# Patient Record
Sex: Female | Born: 1970
Health system: Southern US, Community
[De-identification: ages and names within clinical notes are randomized; demographics above are authoritative.]

## PROBLEM LIST (undated history)

## (undated) DIAGNOSIS — E0590 Thyrotoxicosis, unspecified without thyrotoxic crisis or storm: Secondary | ICD-10-CM

## (undated) DIAGNOSIS — C801 Malignant (primary) neoplasm, unspecified: Secondary | ICD-10-CM

## (undated) DIAGNOSIS — F329 Major depressive disorder, single episode, unspecified: Secondary | ICD-10-CM

## (undated) DIAGNOSIS — K589 Irritable bowel syndrome without diarrhea: Secondary | ICD-10-CM

## (undated) DIAGNOSIS — G43909 Migraine, unspecified, not intractable, without status migrainosus: Secondary | ICD-10-CM

## (undated) DIAGNOSIS — N871 Moderate cervical dysplasia: Secondary | ICD-10-CM

## (undated) DIAGNOSIS — K5904 Chronic idiopathic constipation: Secondary | ICD-10-CM

## (undated) DIAGNOSIS — K219 Gastro-esophageal reflux disease without esophagitis: Secondary | ICD-10-CM

## (undated) DIAGNOSIS — F419 Anxiety disorder, unspecified: Secondary | ICD-10-CM

## (undated) DIAGNOSIS — M722 Plantar fascial fibromatosis: Secondary | ICD-10-CM

## (undated) DIAGNOSIS — F32A Depression, unspecified: Secondary | ICD-10-CM

## (undated) HISTORY — DX: Thyrotoxicosis, unspecified without thyrotoxic crisis or storm: E05.90

## (undated) HISTORY — DX: Depression, unspecified: F32.A

## (undated) HISTORY — PX: COLONOSCOPY: SHX174

## (undated) HISTORY — DX: Chronic idiopathic constipation: K59.04

## (undated) HISTORY — PX: OTHER SURGICAL HISTORY: SHX169

## (undated) HISTORY — DX: Migraine, unspecified, not intractable, without status migrainosus: G43.909

## (undated) HISTORY — DX: Gastro-esophageal reflux disease without esophagitis: K21.9

## (undated) HISTORY — DX: Irritable bowel syndrome, unspecified: K58.9

## (undated) HISTORY — PX: MOHS SURGERY: SUR867

## (undated) HISTORY — DX: Moderate cervical dysplasia: N87.1

## (undated) HISTORY — DX: Anxiety disorder, unspecified: F41.9

## (undated) HISTORY — DX: Major depressive disorder, single episode, unspecified: F32.9

## (undated) HISTORY — DX: Malignant (primary) neoplasm, unspecified: C80.1

## (undated) HISTORY — DX: Plantar fascial fibromatosis: M72.2

---

## 1996-01-18 DIAGNOSIS — N871 Moderate cervical dysplasia: Secondary | ICD-10-CM

## 1996-01-18 HISTORY — PX: CERVICAL BIOPSY  W/ LOOP ELECTRODE EXCISION: SUR135

## 1996-01-18 HISTORY — DX: Moderate cervical dysplasia: N87.1

## 1997-05-19 ENCOUNTER — Other Ambulatory Visit: Admission: RE | Admit: 1997-05-19 | Discharge: 1997-05-19 | Payer: Self-pay | Admitting: Gynecology

## 1997-08-29 ENCOUNTER — Ambulatory Visit (HOSPITAL_COMMUNITY): Admission: RE | Admit: 1997-08-29 | Discharge: 1997-08-29 | Payer: Self-pay | Admitting: Gastroenterology

## 2000-11-30 ENCOUNTER — Other Ambulatory Visit: Admission: RE | Admit: 2000-11-30 | Discharge: 2000-11-30 | Payer: Self-pay | Admitting: Gynecology

## 2002-01-09 ENCOUNTER — Other Ambulatory Visit: Admission: RE | Admit: 2002-01-09 | Discharge: 2002-01-09 | Payer: Self-pay | Admitting: Gynecology

## 2003-02-27 ENCOUNTER — Emergency Department (HOSPITAL_COMMUNITY): Admission: EM | Admit: 2003-02-27 | Discharge: 2003-02-27 | Payer: Self-pay | Admitting: Family Medicine

## 2003-03-11 ENCOUNTER — Other Ambulatory Visit: Admission: RE | Admit: 2003-03-11 | Discharge: 2003-03-11 | Payer: Self-pay | Admitting: Gynecology

## 2004-04-21 ENCOUNTER — Other Ambulatory Visit: Admission: RE | Admit: 2004-04-21 | Discharge: 2004-04-21 | Payer: Self-pay | Admitting: Gynecology

## 2005-06-17 ENCOUNTER — Other Ambulatory Visit: Admission: RE | Admit: 2005-06-17 | Discharge: 2005-06-17 | Payer: Self-pay | Admitting: Gynecology

## 2006-01-31 ENCOUNTER — Encounter: Admission: RE | Admit: 2006-01-31 | Discharge: 2006-01-31 | Payer: Self-pay | Admitting: Endocrinology

## 2006-06-30 ENCOUNTER — Other Ambulatory Visit: Admission: RE | Admit: 2006-06-30 | Discharge: 2006-06-30 | Payer: Self-pay | Admitting: Gynecology

## 2007-08-02 ENCOUNTER — Other Ambulatory Visit: Admission: RE | Admit: 2007-08-02 | Discharge: 2007-08-02 | Payer: Self-pay | Admitting: Gynecology

## 2008-08-06 ENCOUNTER — Other Ambulatory Visit: Admission: RE | Admit: 2008-08-06 | Discharge: 2008-08-06 | Payer: Self-pay | Admitting: Gynecology

## 2008-08-06 ENCOUNTER — Encounter: Payer: Self-pay | Admitting: Women's Health

## 2008-08-06 ENCOUNTER — Ambulatory Visit: Payer: Self-pay | Admitting: Women's Health

## 2009-08-28 ENCOUNTER — Other Ambulatory Visit: Admission: RE | Admit: 2009-08-28 | Discharge: 2009-08-28 | Payer: Self-pay | Admitting: Gynecology

## 2009-08-28 ENCOUNTER — Ambulatory Visit: Payer: Self-pay | Admitting: Women's Health

## 2009-09-03 ENCOUNTER — Encounter: Admission: RE | Admit: 2009-09-03 | Discharge: 2009-09-03 | Payer: Self-pay | Admitting: Gynecology

## 2010-09-02 DIAGNOSIS — E0590 Thyrotoxicosis, unspecified without thyrotoxic crisis or storm: Secondary | ICD-10-CM | POA: Insufficient documentation

## 2010-09-09 ENCOUNTER — Encounter: Payer: Self-pay | Admitting: Women's Health

## 2010-09-10 ENCOUNTER — Other Ambulatory Visit (HOSPITAL_COMMUNITY)
Admission: RE | Admit: 2010-09-10 | Discharge: 2010-09-10 | Disposition: A | Discharge status: Home / Self Care | Payer: BC Managed Care – PPO | Source: Ambulatory Visit | Attending: Women's Health | Admitting: Women's Health

## 2010-09-10 ENCOUNTER — Ambulatory Visit (INDEPENDENT_AMBULATORY_CARE_PROVIDER_SITE_OTHER): Payer: BC Managed Care – PPO | Admitting: Women's Health

## 2010-09-10 ENCOUNTER — Encounter: Payer: Self-pay | Admitting: Women's Health

## 2010-09-10 VITALS — BP 130/90 | Ht 62.0 in | Wt 156.0 lb

## 2010-09-10 DIAGNOSIS — Z309 Encounter for contraceptive management, unspecified: Secondary | ICD-10-CM

## 2010-09-10 DIAGNOSIS — F411 Generalized anxiety disorder: Secondary | ICD-10-CM

## 2010-09-10 DIAGNOSIS — N809 Endometriosis, unspecified: Secondary | ICD-10-CM | POA: Insufficient documentation

## 2010-09-10 DIAGNOSIS — Z131 Encounter for screening for diabetes mellitus: Secondary | ICD-10-CM

## 2010-09-10 DIAGNOSIS — Z01419 Encounter for gynecological examination (general) (routine) without abnormal findings: Secondary | ICD-10-CM | POA: Insufficient documentation

## 2010-09-10 DIAGNOSIS — L299 Pruritus, unspecified: Secondary | ICD-10-CM

## 2010-09-10 DIAGNOSIS — IMO0001 Reserved for inherently not codable concepts without codable children: Secondary | ICD-10-CM

## 2010-09-10 DIAGNOSIS — F419 Anxiety disorder, unspecified: Secondary | ICD-10-CM | POA: Insufficient documentation

## 2010-09-10 MED ORDER — NYSTATIN-TRIAMCINOLONE 100000-0.1 UNIT/GM-% EX OINT: TOPICAL_OINTMENT | Freq: Two times a day (BID) | CUTANEOUS | Status: AC

## 2010-09-10 MED ORDER — LEVONORGEST-ETH ESTRAD 91-DAY 0.15-0.03 MG PO TABS: 1.0000 | ORAL_TABLET | Freq: Every day | ORAL | Status: DC

## 2010-09-10 MED ORDER — VENLAFAXINE HCL 100 MG PO TABS: 100.0000 mg | ORAL_TABLET | Freq: Once | ORAL | Status: DC

## 2010-09-10 NOTE — Progress Notes (Signed)
Carrie Jensen March 23, 1970 [id number]    History:    The patient presents for annual exam.  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   Past medical history, past surgical history, family history and social history were all reviewed and documented in the EPIC chart.   ROS:  A  ROS was performed and pertinent positives and negatives are included in the history.  Exam:  Filed Vitals:   09/10/10 1622  BP: 130/90    General appearance:  Normal Head/Neck:  Normal, without cervical or supraclavicular adenopathy. Thyroid:  Symmetrical, normal in size, without palpable masses or nodularity. Respiratory  Effort:  Normal  Auscultation:  Clear without wheezing or rhonchi Cardiovascular  Auscultation:  Regular rate, without rubs, murmurs or gallops  Edema/varicosities:  Not grossly evident Abdominal  Soft,nontender, without masses, guarding or rebound.  Liver/spleen:  No organomegaly noted  Hernia:  None appreciated  Skin  Inspection:  Grossly normal  Palpation:  Grossly normal Neurologic/psychiatric  Orientation:  Normal with appropriate conversation.  Mood/affect:  Normal  Genitourinary    Breasts: Examined lying and sitting.     Right: Without masses, retractions, discharge or axillary adenopathy.     Left: Without masses, retractions, discharge or axillary adenopathy.   Inguinal/mons:  Normal without inguinal adenopathy  External genitalia:  Normal  BUS/Urethra/Skene's glands:  Normal  Bladder:  Normal  Vagina:  Normal  Cervix:  Normal status post leep (98)  Uterus: Fibroids/nontender, 10 wk size,  Midline and mobile  Adnexa/parametria:     Rt: Without masses or tenderness.   Lt: Without masses or tenderness.  Anus and perineum: Normal  Digital rectal exam: Normal sphincter tone without palpated masses or tenderness  Assessment/Plan:  40 y.o.MWF G0  for annual exam.  On Seasonale having a 3-5 days cycle every 3 months. Having less problems with migraines. She does  see Dr. Talmage Nap for her hyperthyroidism. SBEs, yearly mammogram, which have been normal. 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 Will check her blood pressure at work and call if greater than 130/80. She exercises on a regular basis and will continue, she's been on Effexor for anxiety states she feels she needs a higher dose. Will try 100 she's been on Effexor 75 will call if no relief and did review we could increase it to 150. Reviewed importance of leisure activities and exercise in relationship to stress  Prescription for the Seasonale was given, did review slight risk for blood clots,strokes. Will check a CBC glucose Pap today. Will check a lipid profile next year, she had a normal lipid profile last year.    Harrington Challenger [facility], 5:39 PM 09/10/2010

## 2010-09-15 MED ORDER — VENLAFAXINE HCL ER 37.5 MG PO CP24: 37.5000 mg | ORAL_CAPSULE | Freq: Every day | ORAL | Status: DC

## 2010-09-15 MED ORDER — VENLAFAXINE HCL ER 75 MG PO CP24: 75.0000 mg | ORAL_CAPSULE | Freq: Every day | ORAL | Status: DC

## 2010-09-15 NOTE — Progress Notes (Signed)
Addended by: Harrington Challenger on: 09/15/2010 02:16 PM   Modules accepted: Orders

## 2011-10-17 ENCOUNTER — Other Ambulatory Visit: Payer: Self-pay | Admitting: Women's Health

## 2011-10-17 ENCOUNTER — Ambulatory Visit (INDEPENDENT_AMBULATORY_CARE_PROVIDER_SITE_OTHER): Payer: BC Managed Care – PPO | Admitting: Women's Health

## 2011-10-17 ENCOUNTER — Encounter: Payer: Self-pay | Admitting: Women's Health

## 2011-10-17 VITALS — BP 132/84 | Ht 61.5 in | Wt 156.0 lb

## 2011-10-17 DIAGNOSIS — Z01419 Encounter for gynecological examination (general) (routine) without abnormal findings: Secondary | ICD-10-CM

## 2011-10-17 DIAGNOSIS — Z13220 Encounter for screening for lipoid disorders: Secondary | ICD-10-CM

## 2011-10-17 DIAGNOSIS — F419 Anxiety disorder, unspecified: Secondary | ICD-10-CM

## 2011-10-17 DIAGNOSIS — E079 Disorder of thyroid, unspecified: Secondary | ICD-10-CM

## 2011-10-17 DIAGNOSIS — R519 Headache, unspecified: Secondary | ICD-10-CM

## 2011-10-17 DIAGNOSIS — Z309 Encounter for contraceptive management, unspecified: Secondary | ICD-10-CM

## 2011-10-17 DIAGNOSIS — Z833 Family history of diabetes mellitus: Secondary | ICD-10-CM

## 2011-10-17 DIAGNOSIS — IMO0001 Reserved for inherently not codable concepts without codable children: Secondary | ICD-10-CM

## 2011-10-17 DIAGNOSIS — R51 Headache: Secondary | ICD-10-CM

## 2011-10-17 DIAGNOSIS — F411 Generalized anxiety disorder: Secondary | ICD-10-CM

## 2011-10-17 MED ORDER — PROMETHAZINE HCL 25 MG PO TABS: 25.0000 mg | ORAL_TABLET | Freq: Four times a day (QID) | ORAL | Status: DC | PRN

## 2011-10-17 MED ORDER — VENLAFAXINE HCL ER 75 MG PO CP24: 75.0000 mg | ORAL_CAPSULE | Freq: Every day | ORAL | Status: DC

## 2011-10-17 MED ORDER — ELETRIPTAN HYDROBROMIDE 40 MG PO TABS: ORAL_TABLET | ORAL | Status: DC

## 2011-10-17 MED ORDER — ELETRIPTAN HYDROBROMIDE 20 MG PO TABS: ORAL_TABLET | ORAL | Status: DC

## 2011-10-17 MED ORDER — LEVONORGEST-ETH ESTRAD 91-DAY 0.15-0.03 MG PO TABS: 1.0000 | ORAL_TABLET | Freq: Every day | ORAL | Status: DC

## 2011-10-17 NOTE — Progress Notes (Signed)
Carrie Jensen August 28, 1970 [id number]    History:    The patient presents for annual exam.  Cycles every 3 months on Seasonale without complaint. States cycles are much lighter and would like to continue. Normal mammogram in 2011. 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 History of hyperthyroidism evaluated by Dr. Talmage Nap on no medication. Benign cystectomy with partial LSO with endometriosis 1998. History of GERD on protonic. History of migraines uses Relpax rarely.   Past medical history, past surgical history, family history and social history were all reviewed and documented in the EPIC chart. Father died family members taking advantage of mother that is causing some increased stress. Nurse in long-term care setting   ROS:  A  ROS was performed and pertinent positives and negatives are included in the history.  Exam:  Filed Vitals:   10/17/11 1556  BP: 132/84    General appearance:  Normal Head/Neck:  Normal, without cervical or supraclavicular adenopathy. Thyroid:  Symmetrical, normal in size, without palpable masses or nodularity. Respiratory  Effort:  Normal  Auscultation:  Clear without wheezing or rhonchi Cardiovascular  Auscultation:  Regular rate, without rubs, murmurs or gallops  Edema/varicosities:  Not grossly evident Abdominal  Soft,nontender, without masses, guarding or rebound.  Liver/spleen:  No organomegaly noted  Hernia:  None appreciated  Skin  Inspection:  Grossly normal  Palpation:  Grossly normal Neurologic/psychiatric  Orientation:  Normal with appropriate conversation.  Mood/affect:  Normal  Genitourinary    Breasts: Examined lying and sitting.     Right: Without masses, retractions, discharge or axillary adenopathy.     Left: Without masses, retractions, discharge or axillary adenopathy.   Inguinal/mons:  Normal without inguinal adenopathy  External genitalia:  Normal  BUS/Urethra/Skene's glands:  Normal  Bladder:   Normal  Vagina:  Normal  Cervix:  Appears to be polyp-like/status post LEEP  Uterus:   normal in size, shape and contour.  Midline and mobile  Adnexa/parametria:     Rt: Without masses or tenderness.   Lt: Without masses or tenderness.  Anus and perineum: Normal  Digital rectal exam: Normal sphincter tone without palpated masses or tenderness  Assessment/Plan:  41 y.o. M. WF G0 for annual exam.   LEEP 1998 for CIN-2 normal Paps after History of endometriosis/Seasonale cycles every 3 months minimal pain Rare migraines/Relpax 20 mg and Phenergan 25 when necessary Depression/Situational stress -family issues  Plan: Seasonale prescription, proper use, slight risk for blood clots and strokes reviewed. Effexor 75 XL by mouth daily, prescription, proper use given and reviewed. Encouraged counseling, Consuelo Pandy name and number was given to schedule. SBE's, overdue for mammogram and instructed to schedule. Encouraged to continue exercise decrease calories for weight loss, vitamin D 1000 daily, calcium rich foods encouraged. CBC, glucose, lipid panel, TSH, UA. No Pap history of normal Paps greater than 10 years. New screening guidelines reviewed.Relpax 20 mg prescription given with instructions to take one at onset may repeat in 2 hours if necessary no more than 2 tablets in one day. Phenergan 25 one tablet as needed for nausea with headaches.    Harrington Challenger [facility], 4:43 PM 10/17/2011

## 2011-10-17 NOTE — Patient Instructions (Signed)

## 2011-10-18 ENCOUNTER — Encounter: Payer: Self-pay | Admitting: Women's Health

## 2011-10-18 ENCOUNTER — Other Ambulatory Visit: Payer: Self-pay | Admitting: Women's Health

## 2011-10-18 LAB — CBC WITH DIFFERENTIAL/PLATELET
Basophils Relative: 0 % (ref 0–1)
HCT: 42.4 % (ref 36.0–46.0)
Hemoglobin: 14.2 g/dL (ref 12.0–15.0)
Lymphocytes Relative: 40 % (ref 12–46)
Lymphs Abs: 3.5 10*3/uL (ref 0.7–4.0)
MCH: 32.4 pg (ref 26.0–34.0)
MCHC: 33.5 g/dL (ref 30.0–36.0)
Monocytes Absolute: 0.4 10*3/uL (ref 0.1–1.0)
Neutro Abs: 4.7 10*3/uL (ref 1.7–7.7)
Neutrophils Relative %: 54 % (ref 43–77)
Platelets: 329 10*3/uL (ref 150–400)
RBC: 4.38 MIL/uL (ref 3.87–5.11)
RDW: 12.1 % (ref 11.5–15.5)

## 2011-10-18 LAB — URINALYSIS W MICROSCOPIC + REFLEX CULTURE
Bacteria, UA: NONE SEEN
Bilirubin Urine: NEGATIVE
Crystals: NONE SEEN
Glucose, UA: NEGATIVE mg/dL
Hgb urine dipstick: NEGATIVE
Ketones, ur: NEGATIVE mg/dL
Nitrite: NEGATIVE

## 2011-10-18 LAB — GLUCOSE, RANDOM: Glucose, Bld: 88 mg/dL (ref 70–99)

## 2011-10-18 LAB — LIPID PANEL: Triglycerides: 194 mg/dL — ABNORMAL HIGH (ref <150)

## 2011-11-28 ENCOUNTER — Encounter: Payer: Self-pay | Admitting: Women's Health

## 2012-07-22 ENCOUNTER — Other Ambulatory Visit: Payer: Self-pay | Admitting: Women's Health

## 2012-10-17 ENCOUNTER — Encounter: Payer: Self-pay | Admitting: Women's Health

## 2012-11-02 ENCOUNTER — Telehealth: Payer: Self-pay | Admitting: *Deleted

## 2012-11-02 DIAGNOSIS — IMO0001 Reserved for inherently not codable concepts without codable children: Secondary | ICD-10-CM

## 2012-11-02 MED ORDER — LEVONORGEST-ETH ESTRAD 91-DAY 0.15-0.03 MG PO TABS: 1.0000 | ORAL_TABLET | Freq: Every day | ORAL | Status: DC

## 2012-11-02 NOTE — Telephone Encounter (Signed)
Pt has annual scheduled on 11/14/12 requesting refill on birth control pills until annual. 1 pack sent.

## 2012-11-14 ENCOUNTER — Encounter: Payer: Self-pay | Admitting: Women's Health

## 2012-11-14 ENCOUNTER — Other Ambulatory Visit (HOSPITAL_COMMUNITY)
Admission: RE | Admit: 2012-11-14 | Discharge: 2012-11-14 | Disposition: A | Discharge status: Home / Self Care | Payer: BC Managed Care – PPO | Source: Ambulatory Visit | Attending: Gynecology | Admitting: Gynecology

## 2012-11-14 ENCOUNTER — Ambulatory Visit (INDEPENDENT_AMBULATORY_CARE_PROVIDER_SITE_OTHER): Payer: BC Managed Care – PPO | Admitting: Women's Health

## 2012-11-14 VITALS — BP 130/82 | Ht 61.5 in | Wt 153.4 lb

## 2012-11-14 DIAGNOSIS — R519 Headache, unspecified: Secondary | ICD-10-CM

## 2012-11-14 DIAGNOSIS — Z01419 Encounter for gynecological examination (general) (routine) without abnormal findings: Secondary | ICD-10-CM

## 2012-11-14 DIAGNOSIS — E0590 Thyrotoxicosis, unspecified without thyrotoxic crisis or storm: Secondary | ICD-10-CM

## 2012-11-14 DIAGNOSIS — F4323 Adjustment disorder with mixed anxiety and depressed mood: Secondary | ICD-10-CM

## 2012-11-14 DIAGNOSIS — R51 Headache: Secondary | ICD-10-CM

## 2012-11-14 DIAGNOSIS — Z309 Encounter for contraceptive management, unspecified: Secondary | ICD-10-CM

## 2012-11-14 DIAGNOSIS — IMO0001 Reserved for inherently not codable concepts without codable children: Secondary | ICD-10-CM

## 2012-11-14 DIAGNOSIS — Z13220 Encounter for screening for lipoid disorders: Secondary | ICD-10-CM

## 2012-11-14 DIAGNOSIS — Z833 Family history of diabetes mellitus: Secondary | ICD-10-CM

## 2012-11-14 MED ORDER — VENLAFAXINE HCL ER 75 MG PO CP24: ORAL_CAPSULE | ORAL | Status: DC

## 2012-11-14 MED ORDER — LEVONORGEST-ETH ESTRAD 91-DAY 0.15-0.03 MG PO TABS: 1.0000 | ORAL_TABLET | Freq: Every day | ORAL | Status: DC

## 2012-11-14 MED ORDER — ELETRIPTAN HYDROBROMIDE 40 MG PO TABS: ORAL_TABLET | ORAL | Status: DC

## 2012-11-14 NOTE — Progress Notes (Signed)
SHEMAIAH ROUND [birth year] [id number]    History:    The patient presents for annual exam.  Light cycle every 3 months on Seasonale. History of endometriosis. 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 Ovarian cystectomy with partial LSO. Normal mammogram history. Migraines without aura, Relpax occasionally. Anxiety/depression stable on Effexor.  Past medical history, past surgical history, family history and social history were all reviewed and documented in the EPIC chart. 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   ROS:  A  ROS was performed and pertinent positives and negatives are included in the history.  Exam:  Filed Vitals:   11/14/12 1504  BP: 130/82    General appearance:  Normal Head/Neck:  Normal, without cervical or supraclavicular adenopathy. Thyroid:  Symmetrical, normal in size, without palpable masses or nodularity. Respiratory  Effort:  Normal  Auscultation:  Clear without wheezing or rhonchi Cardiovascular  Auscultation:  Regular rate, without rubs, murmurs or gallops  Edema/varicosities:  Not grossly evident Abdominal  Soft,nontender, without masses, guarding or rebound.  Liver/spleen:  No organomegaly noted  Hernia:  None appreciated  Skin  Inspection:  Grossly normal  Palpation:  Grossly normal Neurologic/psychiatric  Orientation:  Normal with appropriate conversation.  Mood/affect:  Normal  Genitourinary    Breasts: Examined lying and sitting.     Right: Without masses, retractions, discharge or axillary adenopathy.     Left: Without masses, retractions, discharge or axillary adenopathy.   Inguinal/mons:  Normal without inguinal adenopathy  External genitalia:  Normal  BUS/Urethra/Skene's glands:  Normal  Bladder:  Normal  Vagina:  Normal  Cervix:  Normal  Uterus:   normal in size, shape and contour.  Midline and mobile  Adnexa/parametria:     Rt: Without masses or tenderness.   Lt: Without masses or tenderness.  Anus  and perineum: Normal  Digital rectal exam: Normal sphincter tone without palpated masses or tenderness  Assessment/Plan:  42 y.o. MWF G0 for annual exam with no complaints.  LEEP 1998 with normal Paps after Endometriosis/Seasonale Anxiety/depression stable on Effexor Migraine /Relpax   Plan: Seasonale prescription, proper use, slight risk for blood clots and strokes reviewed. SBE's, annual mammogram, overdue instructed to schedule. Continue regular exercise, calcium rich diet, vitamin D 1000 daily encouraged. Relpax prescription, proper use given and reviewed for occasional migraine. 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 Effexor 75 daily, prescription, proper use given and reviewed denies need for counseling or medication adjustment. CBC, lipid panel, thyroid panel, UA, Pap. Pap normal 2012, new screening guidelines reviewed. Planning to return to office 12/03/2012 fasting for labs.    Harrington Challenger [facility], 5:23 PM 11/14/2012

## 2012-11-14 NOTE — Patient Instructions (Signed)

## 2012-11-21 ENCOUNTER — Encounter: Payer: Self-pay | Admitting: Women's Health

## 2012-12-03 ENCOUNTER — Other Ambulatory Visit: Payer: BC Managed Care – PPO

## 2012-12-04 ENCOUNTER — Other Ambulatory Visit: Payer: BC Managed Care – PPO

## 2012-12-04 ENCOUNTER — Other Ambulatory Visit: Payer: Self-pay | Admitting: Women's Health

## 2012-12-04 DIAGNOSIS — E079 Disorder of thyroid, unspecified: Secondary | ICD-10-CM

## 2012-12-04 DIAGNOSIS — Z01419 Encounter for gynecological examination (general) (routine) without abnormal findings: Secondary | ICD-10-CM

## 2012-12-04 DIAGNOSIS — Z833 Family history of diabetes mellitus: Secondary | ICD-10-CM

## 2012-12-04 LAB — CBC WITH DIFFERENTIAL/PLATELET
Basophils Relative: 0 % (ref 0–1)
Eosinophils Absolute: 0.1 10*3/uL (ref 0.0–0.7)
Eosinophils Relative: 1 % (ref 0–5)
HCT: 41.6 % (ref 36.0–46.0)
Hemoglobin: 14.5 g/dL (ref 12.0–15.0)
Lymphocytes Relative: 34 % (ref 12–46)
Lymphs Abs: 2.5 10*3/uL (ref 0.7–4.0)
MCH: 32.4 pg (ref 26.0–34.0)
MCV: 93.1 fL (ref 78.0–100.0)
Monocytes Relative: 6 % (ref 3–12)
Neutro Abs: 4.3 10*3/uL (ref 1.7–7.7)
Platelets: 358 10*3/uL (ref 150–400)
RBC: 4.47 MIL/uL (ref 3.87–5.11)
WBC: 7.4 10*3/uL (ref 4.0–10.5)

## 2012-12-04 LAB — COMPREHENSIVE METABOLIC PANEL
ALT: <8 U/L (ref 0–35)
AST: 14 U/L (ref 0–37)
Albumin: 3.6 g/dL (ref 3.5–5.2)
BUN: 13 mg/dL (ref 6–23)
CO2: 23 mEq/L (ref 19–32)
Calcium: 8.9 mg/dL (ref 8.4–10.5)
Chloride: 105 mEq/L (ref 96–112)
Potassium: 4 mEq/L (ref 3.5–5.3)

## 2013-11-26 ENCOUNTER — Other Ambulatory Visit (HOSPITAL_COMMUNITY)
Admission: RE | Admit: 2013-11-26 | Discharge: 2013-11-26 | Disposition: A | Discharge status: Home / Self Care | Payer: BC Managed Care – PPO | Source: Ambulatory Visit | Attending: Women's Health | Admitting: Women's Health

## 2013-11-26 ENCOUNTER — Ambulatory Visit
Admission: RE | Admit: 2013-11-26 | Discharge: 2013-11-26 | Disposition: A | Discharge status: Home / Self Care | Payer: BC Managed Care – PPO | Source: Ambulatory Visit

## 2013-11-26 ENCOUNTER — Other Ambulatory Visit: Payer: Self-pay

## 2013-11-26 ENCOUNTER — Encounter: Payer: Self-pay | Admitting: Women's Health

## 2013-11-26 ENCOUNTER — Ambulatory Visit (INDEPENDENT_AMBULATORY_CARE_PROVIDER_SITE_OTHER): Payer: BC Managed Care – PPO | Admitting: Women's Health

## 2013-11-26 VITALS — BP 130/80 | Ht 62.0 in | Wt 154.0 lb

## 2013-11-26 DIAGNOSIS — Z3041 Encounter for surveillance of contraceptive pills: Secondary | ICD-10-CM

## 2013-11-26 DIAGNOSIS — B3731 Acute candidiasis of vulva and vagina: Secondary | ICD-10-CM

## 2013-11-26 DIAGNOSIS — Z01419 Encounter for gynecological examination (general) (routine) without abnormal findings: Secondary | ICD-10-CM

## 2013-11-26 DIAGNOSIS — R51 Headache: Secondary | ICD-10-CM

## 2013-11-26 DIAGNOSIS — Z1151 Encounter for screening for human papillomavirus (HPV): Secondary | ICD-10-CM | POA: Insufficient documentation

## 2013-11-26 DIAGNOSIS — Z1231 Encounter for screening mammogram for malignant neoplasm of breast: Secondary | ICD-10-CM

## 2013-11-26 DIAGNOSIS — R519 Headache, unspecified: Secondary | ICD-10-CM

## 2013-11-26 DIAGNOSIS — B373 Candidiasis of vulva and vagina: Secondary | ICD-10-CM

## 2013-11-26 DIAGNOSIS — N898 Other specified noninflammatory disorders of vagina: Secondary | ICD-10-CM

## 2013-11-26 DIAGNOSIS — Z13220 Encounter for screening for lipoid disorders: Secondary | ICD-10-CM

## 2013-11-26 DIAGNOSIS — E0590 Thyrotoxicosis, unspecified without thyrotoxic crisis or storm: Secondary | ICD-10-CM

## 2013-11-26 LAB — CBC WITH DIFFERENTIAL/PLATELET
BASOS ABS: 0 10*3/uL (ref 0.0–0.1)
BASOS PCT: 0 % (ref 0–1)
EOS PCT: 1 % (ref 0–5)
Eosinophils Absolute: 0.1 10*3/uL (ref 0.0–0.7)
HCT: 44.9 % (ref 36.0–46.0)
Hemoglobin: 15.3 g/dL — ABNORMAL HIGH (ref 12.0–15.0)
Lymphocytes Relative: 31 % (ref 12–46)
Lymphs Abs: 2.2 10*3/uL (ref 0.7–4.0)
MCH: 32.7 pg (ref 26.0–34.0)
MCHC: 34.1 g/dL (ref 30.0–36.0)
MCV: 95.9 fL (ref 78.0–100.0)
Monocytes Absolute: 0.4 10*3/uL (ref 0.1–1.0)
Monocytes Relative: 5 % (ref 3–12)
NEUTROS ABS: 4.5 10*3/uL (ref 1.7–7.7)
Neutrophils Relative %: 63 % (ref 43–77)
Platelets: 348 10*3/uL (ref 150–400)
RBC: 4.68 MIL/uL (ref 3.87–5.11)
RDW: 12.4 % (ref 11.5–15.5)
WBC: 7.1 10*3/uL (ref 4.0–10.5)

## 2013-11-26 LAB — WET PREP FOR TRICH, YEAST, CLUE
Clue Cells Wet Prep HPF POC: NONE SEEN
Trich, Wet Prep: NONE SEEN

## 2013-11-26 LAB — COMPREHENSIVE METABOLIC PANEL WITH GFR
ALT: <8 U/L (ref 0–35)
AST: 15 U/L (ref 0–37)
Albumin: 3.9 g/dL (ref 3.5–5.2)
Alkaline Phosphatase: 68 U/L (ref 39–117)
BUN: 12 mg/dL (ref 6–23)
CO2: 21 meq/L (ref 19–32)
Calcium: 9.4 mg/dL (ref 8.4–10.5)
Chloride: 105 meq/L (ref 96–112)
Creat: 0.69 mg/dL (ref 0.50–1.10)
Glucose, Bld: 87 mg/dL (ref 70–99)
Potassium: 3.9 meq/L (ref 3.5–5.3)
Sodium: 138 meq/L (ref 135–145)
Total Bilirubin: 0.9 mg/dL (ref 0.2–1.2)
Total Protein: 6.6 g/dL (ref 6.0–8.3)

## 2013-11-26 LAB — LIPID PANEL
Cholesterol: 216 mg/dL — ABNORMAL HIGH (ref 0–200)
HDL: 54 mg/dL (ref >39)
LDL CALC: 130 mg/dL — AB (ref 0–99)
Total CHOL/HDL Ratio: 4 Ratio
Triglycerides: 161 mg/dL — ABNORMAL HIGH (ref <150)
VLDL: 32 mg/dL (ref 0–40)

## 2013-11-26 LAB — TSH: TSH: 0.77 u[IU]/mL (ref 0.350–4.500)

## 2013-11-26 MED ORDER — ELETRIPTAN HYDROBROMIDE 40 MG PO TABS: ORAL_TABLET | ORAL | Status: DC

## 2013-11-26 MED ORDER — NORETHIN ACE-ETH ESTRAD-FE 1-20 MG-MCG PO TABS: ORAL_TABLET | ORAL | Status: DC

## 2013-11-26 MED ORDER — FLUCONAZOLE 150 MG PO TABS: ORAL_TABLET | ORAL | Status: DC

## 2013-11-26 MED ORDER — PROMETHAZINE HCL 25 MG PO TABS: 25.0000 mg | ORAL_TABLET | Freq: Four times a day (QID) | ORAL | Status: DC | PRN

## 2013-11-26 NOTE — Progress Notes (Signed)
Carrie Jensen 31-Oct-1970 [id number]    History:    Presents for annual exam.  Cycles every 3 months on Seasonale. 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 History of migraines without aura rare headaches. Last mammogram 2011. History of endometriosis. Anxiety/depression stable on Effexor. History of Hyperthyroid with normal thyroid function now. Complaint of vaginal itching without relief with Monistat.  Past medical history, past surgical history, family history and social history were all reviewed and documented in the EPIC chart. 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 Father died lung cancer.  ROS:  A  12 point ROS was performed and pertinent positives and negatives are included.  Exam:  Filed Vitals:   11/26/13 0821  BP: 130/80    General appearance:  Normal Thyroid:  Symmetrical, normal in size, without palpable masses or nodularity. Respiratory  Auscultation:  Clear without wheezing or rhonchi Cardiovascular  Auscultation:  Regular rate, without rubs, murmurs or gallops  Edema/varicosities:  Not grossly evident Abdominal  Soft,nontender, without masses, guarding or rebound.  Liver/spleen:  No organomegaly noted  Hernia:  None appreciated  Skin  Inspection:  Grossly normal   Breasts: Examined lying and sitting.     Right: Without masses, retractions, discharge or axillary adenopathy.     Left: Without masses, retractions, discharge or axillary adenopathy. Gentitourinary   Inguinal/mons:  Normal without inguinal adenopathy  External genitalia:  Normal  BUS/Urethra/Skene's glands:  Normal  Vagina: erythematous, but prep positive for yeast  Cervix:  Normal  Uterus:  normal in size, shape and contour.  Midline and mobile  Adnexa/parametria:     Rt: Without masses or tenderness.   Lt: Without masses or tenderness.  Anus and perineum: Normal  Digital rectal exam: Normal sphincter tone without palpated masses or  tenderness  Assessment/Plan:  43 y.o.MWF G0  for annual exam.     Yeast vaginitis 1998 LEEP CIN-2 normal Paps after Anxiety/depression stable on Effexor Cycles every 3 months on Seasonale Migraines without aura - rare  Plan: Effexor 75 mg by mouth daily prescription, proper use given and reviewed. Denies need for counseling at this time. Will try Loestrin 1/20 prescription, proper use given and reviewed, will take continuously for 3 months. Will call if problems with change. SBE's, overdue for mammogram, reviewed importance of annual screen, instructed to schedule. Continue regular exercise, calcium rich diet, vitamin D 1000 daily encouraged. CBC, lipid panel, CMP, TSH, UA, Pap with HR HPV typing, new screening guidelines reviewed.      [place], 8:59 AM 11/26/2013

## 2013-11-26 NOTE — Addendum Note (Signed)
Addended by: Burnett Kanaris on: 11/26/2013 09:21 AM   Modules accepted: Orders, SmartSet

## 2013-11-26 NOTE — Patient Instructions (Signed)

## 2013-11-26 NOTE — Addendum Note (Signed)
Addended by: Burnett Kanaris on: 11/26/2013 09:54 AM   Modules accepted: Orders, SmartSet

## 2013-11-27 LAB — CYTOLOGY - PAP

## 2013-11-27 LAB — URINALYSIS W MICROSCOPIC + REFLEX CULTURE
BILIRUBIN URINE: NEGATIVE
CASTS: NONE SEEN
CRYSTALS: NONE SEEN
Glucose, UA: NEGATIVE mg/dL
Ketones, ur: NEGATIVE mg/dL
Nitrite: NEGATIVE
PROTEIN: NEGATIVE mg/dL
Specific Gravity, Urine: 1.02 (ref 1.005–1.030)
Urobilinogen, UA: 0.2 mg/dL (ref 0.0–1.0)
pH: 5.5 (ref 5.0–8.0)

## 2013-11-28 ENCOUNTER — Other Ambulatory Visit: Payer: Self-pay | Admitting: Women's Health

## 2013-11-28 DIAGNOSIS — R928 Other abnormal and inconclusive findings on diagnostic imaging of breast: Secondary | ICD-10-CM

## 2013-11-28 LAB — URINE CULTURE: Colony Count: 85000

## 2013-12-25 ENCOUNTER — Other Ambulatory Visit: Payer: Self-pay | Admitting: Women's Health

## 2013-12-25 ENCOUNTER — Other Ambulatory Visit: Payer: Self-pay

## 2013-12-25 DIAGNOSIS — R928 Other abnormal and inconclusive findings on diagnostic imaging of breast: Secondary | ICD-10-CM

## 2013-12-27 ENCOUNTER — Other Ambulatory Visit: Payer: BC Managed Care – PPO

## 2014-01-20 ENCOUNTER — Ambulatory Visit
Admission: RE | Admit: 2014-01-20 | Discharge: 2014-01-20 | Disposition: A | Discharge status: Home / Self Care | Payer: BC Managed Care – PPO | Source: Ambulatory Visit | Attending: Women's Health | Admitting: Women's Health

## 2014-01-20 ENCOUNTER — Other Ambulatory Visit: Payer: Self-pay

## 2014-01-20 DIAGNOSIS — R928 Other abnormal and inconclusive findings on diagnostic imaging of breast: Secondary | ICD-10-CM

## 2014-01-20 DIAGNOSIS — F4323 Adjustment disorder with mixed anxiety and depressed mood: Secondary | ICD-10-CM

## 2014-01-20 DIAGNOSIS — Z01419 Encounter for gynecological examination (general) (routine) without abnormal findings: Secondary | ICD-10-CM

## 2014-01-20 MED ORDER — VENLAFAXINE HCL ER 75 MG PO CP24: ORAL_CAPSULE | ORAL | Status: DC

## 2014-05-27 DIAGNOSIS — Z0289 Encounter for other administrative examinations: Secondary | ICD-10-CM

## 2014-07-02 ENCOUNTER — Other Ambulatory Visit: Payer: Self-pay | Admitting: *Deleted

## 2014-07-02 ENCOUNTER — Telehealth: Payer: Self-pay | Admitting: *Deleted

## 2014-07-02 DIAGNOSIS — F4323 Adjustment disorder with mixed anxiety and depressed mood: Secondary | ICD-10-CM

## 2014-07-02 DIAGNOSIS — Z01419 Encounter for gynecological examination (general) (routine) without abnormal findings: Secondary | ICD-10-CM

## 2014-07-02 NOTE — Telephone Encounter (Signed)
error 

## 2014-07-02 NOTE — Telephone Encounter (Signed)
Telephone call, states wants to sleep more than she should, no energy. Currently on Effexor 75 will increase to 150, Burke Keels name and number given instructed to schedule counseling. Feelings of harming self. Continue healthy lifestyle of diet and exercise.  Please E scribe Effexor 150 by mouth daily #30 with 12 refills,

## 2014-07-02 NOTE — Telephone Encounter (Signed)
Message Left

## 2014-07-02 NOTE — Telephone Encounter (Signed)
Pt asked if you could call her at [phone] to speak with you about possible worsen of depression. Please advise

## 2014-07-03 MED ORDER — VENLAFAXINE HCL ER 150 MG PO CP24
150.0000 mg | ORAL_CAPSULE | Freq: Every day | ORAL | Status: DC
Start: 2014-07-03 — End: 2014-12-04

## 2014-07-03 NOTE — Telephone Encounter (Signed)
Rx sent 

## 2014-07-03 NOTE — Addendum Note (Signed)
Addended by: Thamas Jaegers on: 07/03/2014 09:21 AM   Modules accepted: Orders, Medications

## 2014-09-30 ENCOUNTER — Telehealth: Payer: Self-pay | Admitting: *Deleted

## 2014-09-30 NOTE — Telephone Encounter (Signed)
Message left

## 2014-09-30 NOTE — Telephone Encounter (Signed)
Pt called asking if you would be willing to prescribe a muscle relaxer, neck muscle discomfort. Or would you like patient to be seen at urgent care? No PCP Please advise

## 2014-10-01 ENCOUNTER — Other Ambulatory Visit: Payer: Self-pay | Admitting: Women's Health

## 2014-10-01 DIAGNOSIS — M62838 Other muscle spasm: Secondary | ICD-10-CM

## 2014-10-01 MED ORDER — CYCLOBENZAPRINE HCL 5 MG PO TABS: 5.0000 mg | ORAL_TABLET | Freq: Three times a day (TID) | ORAL | Status: DC | PRN

## 2014-10-01 NOTE — Telephone Encounter (Signed)
Message left

## 2014-10-03 NOTE — Telephone Encounter (Signed)
Telephone call, E scribed 20 Flexeril 5 mg for strained muscle, recommended by physical therapist. States has had 2 physical therapy treatments, Flexeril and PT has helped. 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 Will continue to rest, avoid further injury and use Flexeril only at at bedtime.

## 2014-11-28 ENCOUNTER — Encounter: Payer: BC Managed Care – PPO | Admitting: Women's Health

## 2014-12-04 ENCOUNTER — Ambulatory Visit (INDEPENDENT_AMBULATORY_CARE_PROVIDER_SITE_OTHER): Payer: BLUE CROSS/BLUE SHIELD | Admitting: Women's Health

## 2014-12-04 ENCOUNTER — Encounter: Payer: Self-pay | Admitting: Women's Health

## 2014-12-04 VITALS — BP 125/82 | Ht 62.0 in | Wt 131.8 lb

## 2014-12-04 DIAGNOSIS — F329 Major depressive disorder, single episode, unspecified: Secondary | ICD-10-CM | POA: Diagnosis not present

## 2014-12-04 DIAGNOSIS — Z13220 Encounter for screening for lipoid disorders: Secondary | ICD-10-CM

## 2014-12-04 DIAGNOSIS — R519 Headache, unspecified: Secondary | ICD-10-CM

## 2014-12-04 DIAGNOSIS — Z3041 Encounter for surveillance of contraceptive pills: Secondary | ICD-10-CM | POA: Diagnosis not present

## 2014-12-04 DIAGNOSIS — Z1329 Encounter for screening for other suspected endocrine disorder: Secondary | ICD-10-CM

## 2014-12-04 DIAGNOSIS — F32A Depression, unspecified: Secondary | ICD-10-CM

## 2014-12-04 DIAGNOSIS — R51 Headache: Secondary | ICD-10-CM

## 2014-12-04 DIAGNOSIS — Z01419 Encounter for gynecological examination (general) (routine) without abnormal findings: Secondary | ICD-10-CM

## 2014-12-04 LAB — CBC WITH DIFFERENTIAL/PLATELET
BASOS ABS: 0 10*3/uL (ref 0.0–0.1)
Basophils Relative: 0 % (ref 0–1)
Eosinophils Absolute: 0.2 10*3/uL (ref 0.0–0.7)
Eosinophils Relative: 2 % (ref 0–5)
HEMATOCRIT: 41.5 % (ref 36.0–46.0)
Hemoglobin: 14.1 g/dL (ref 12.0–15.0)
LYMPHS ABS: 3.8 10*3/uL (ref 0.7–4.0)
LYMPHS PCT: 45 % (ref 12–46)
MCH: 33.2 pg (ref 26.0–34.0)
MCHC: 34 g/dL (ref 30.0–36.0)
MCV: 97.6 fL (ref 78.0–100.0)
MPV: 8.5 fL — AB (ref 8.6–12.4)
Monocytes Absolute: 0.5 10*3/uL (ref 0.1–1.0)
Monocytes Relative: 6 % (ref 3–12)
NEUTROS PCT: 47 % (ref 43–77)
Neutro Abs: 3.9 10*3/uL (ref 1.7–7.7)
Platelets: 286 10*3/uL (ref 150–400)
RBC: 4.25 MIL/uL (ref 3.87–5.11)
RDW: 12.2 % (ref 11.5–15.5)
WBC: 8.4 10*3/uL (ref 4.0–10.5)

## 2014-12-04 LAB — COMPREHENSIVE METABOLIC PANEL
ALK PHOS: 53 U/L (ref 33–115)
ALT: 8 U/L (ref 6–29)
AST: 18 U/L (ref 10–35)
Albumin: 4 g/dL (ref 3.6–5.1)
BUN: 15 mg/dL (ref 7–25)
CALCIUM: 8.5 mg/dL — AB (ref 8.6–10.2)
CO2: 23 mmol/L (ref 20–31)
Chloride: 104 mmol/L (ref 98–110)
Creat: 0.74 mg/dL (ref 0.50–1.10)
Glucose, Bld: 75 mg/dL (ref 65–99)
POTASSIUM: 3.6 mmol/L (ref 3.5–5.3)
Sodium: 138 mmol/L (ref 135–146)
TOTAL PROTEIN: 6.3 g/dL (ref 6.1–8.1)
Total Bilirubin: 0.8 mg/dL (ref 0.2–1.2)

## 2014-12-04 LAB — LIPID PANEL
CHOL/HDL RATIO: 3.3 ratio (ref <=5.0)
CHOLESTEROL: 192 mg/dL (ref 125–200)
HDL: 59 mg/dL (ref >=46)
LDL Cholesterol: 109 mg/dL (ref <130)
Triglycerides: 119 mg/dL (ref <150)
VLDL: 24 mg/dL (ref <30)

## 2014-12-04 LAB — TSH: TSH: 0.741 u[IU]/mL (ref 0.350–4.500)

## 2014-12-04 MED ORDER — SUMATRIPTAN SUCCINATE 50 MG PO TABS: 50.0000 mg | ORAL_TABLET | ORAL | Status: DC | PRN

## 2014-12-04 MED ORDER — VENLAFAXINE HCL ER 150 MG PO CP24: 150.0000 mg | ORAL_CAPSULE | Freq: Every day | ORAL | Status: DC

## 2014-12-04 MED ORDER — NORETHIN ACE-ETH ESTRAD-FE 1-20 MG-MCG PO TABS: ORAL_TABLET | ORAL | Status: DC

## 2014-12-04 MED ORDER — ELETRIPTAN HYDROBROMIDE 40 MG PO TABS: ORAL_TABLET | ORAL | Status: DC

## 2014-12-04 NOTE — Progress Notes (Signed)
Carrie Jensen December 07, 1970 [id number]    History:    Presents for annual exam.  Cycles every 3 months on Loestrin without complaint. Has lost 23 pounds in the past 6 months with diet and exercise/working with a trainer. 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 History of endometriosis and infertility. Mammogram normal after ultrasound.  Past medical history, past surgical history, family history and social history were all reviewed and documented in the EPIC chart. 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  ROS:  A ROS was performed and pertinent positives and negatives are included.  Exam:  Filed Vitals:   12/04/14 1548  BP: 125/82    General appearance:  Normal Thyroid:  Symmetrical, normal in size, without palpable masses or nodularity. Respiratory  Auscultation:  Clear without wheezing or rhonchi Cardiovascular  Auscultation:  Regular rate, without rubs, murmurs or gallops  Edema/varicosities:  Not grossly evident Abdominal  Soft,nontender, without masses, guarding or rebound.  Liver/spleen:  No organomegaly noted  Hernia:  None appreciated  Skin  Inspection:  Grossly normal   Breasts: Examined lying and sitting.     Right: Without masses, retractions, discharge or axillary adenopathy.     Left: Without masses, retractions, discharge or axillary adenopathy. Gentitourinary   Inguinal/mons:  Normal without inguinal adenopathy  External genitalia:  Normal  BUS/Urethra/Skene's glands:  Normal  Vagina:  Normal  Cervix:  Normal  Uterus:   normal in size, shape and contour.  Midline and mobile  Adnexa/parametria:     Rt: Without masses or tenderness.   Lt: Without masses or tenderness.  Anus and perineum: Normal  Digital rectal exam: Normal sphincter tone without palpated masses or tenderness  Assessment/Plan:  44 y.o. MWF G0 for annual exam no complaints.  Light cycle every 3 months on Loestrin continuously 1998 LEEP for  CIN-2 with normal Paps after Endometriosis/infertility history Anxiety/depression stable on Wellbutrin History of migraines without aura - rare  Plan: Congratulated on weight loss with diet and exercise. SBE's, continue annual mammogram 3-D tomography reviewed and encouraged. Repeat in 150 XL prescription, proper use given and reviewed. Has had counseling in the past denies need at this time. Loestrin 1/20 prescription, proper use, slight risk for blood clots and strokes reviewed. Will continue continuously for 3 months and then cycle. CBC, TSH, CMP, lipid panel, UA, Pap normal with negative HR HPV typing, will repeat next year. Imitrex 50 mg by mouth when necessary with headaches may repeat dose in 2 hours if needed no more than 2 doses in 24 hours. Prescription, proper use given and reviewed.  Carrie Jensen [facility], 5:08 PM 12/04/2014

## 2014-12-04 NOTE — Patient Instructions (Signed)
Health Maintenance, Female Adopting a healthy lifestyle and getting preventive care can go a long way to promote health and wellness. Talk with your health care provider about what schedule of regular examinations is right for you. This is a good chance for you to check in with your provider about disease prevention and staying healthy. In between checkups, there are plenty of things you can do on your own. Experts have done a lot of research about which lifestyle changes and preventive measures are most likely to keep you healthy. Ask your health care provider for more information. WEIGHT AND DIET  Eat a healthy diet  Be sure to include plenty of vegetables, fruits, low-fat dairy products, and lean protein.  Do not eat a lot of foods high in solid fats, added sugars, or salt.  Get regular exercise. This is one of the most important things you can do for your health.  Most adults should exercise for at least 150 minutes each week. The exercise should increase your heart rate and make you sweat (moderate-intensity exercise).  Most adults should also do strengthening exercises at least twice a week. This is in addition to the moderate-intensity exercise.  Maintain a healthy weight  Body mass index (BMI) is a measurement that can be used to identify possible weight problems. It estimates body fat based on height and weight. Your health care provider can help determine your BMI and help you achieve or maintain a healthy weight.  For females 20 years of age and older:   A BMI below 18.5 is considered underweight.  A BMI of 18.5 to 24.9 is normal.  A BMI of 25 to 29.9 is considered overweight.  A BMI of 30 and above is considered obese.  Watch levels of cholesterol and blood lipids  You should start having your blood tested for lipids and cholesterol at 46 years of age, then have this test every 5 years.  You may need to have your cholesterol levels checked more often if:  Your lipid  or cholesterol levels are high.  You are older than 46 years of age.  You are at high risk for heart disease.  CANCER SCREENING   Lung Cancer  Lung cancer screening is recommended for adults 55-80 years old who are at high risk for lung cancer because of a history of smoking.  A yearly low-dose CT scan of the lungs is recommended for people who:  Currently smoke.  Have quit within the past 15 years.  Have at least a 30-pack-year history of smoking. A pack year is smoking an average of one pack of cigarettes a day for 1 year.  Yearly screening should continue until it has been 15 years since you quit.  Yearly screening should stop if you develop a health problem that would prevent you from having lung cancer treatment.  Breast Cancer  Practice breast self-awareness. This means understanding how your breasts normally appear and feel.  It also means doing regular breast self-exams. Let your health care provider know about any changes, no matter how small.  If you are in your 20s or 30s, you should have a clinical breast exam (CBE) by a health care provider every 1-3 years as part of a regular health exam.  If you are 40 or older, have a CBE every year. Also consider having a breast X-ray (mammogram) every year.  If you have a family history of breast cancer, talk to your health care provider about genetic screening.  If you   are at high risk for breast cancer, talk to your health care provider about having an MRI and a mammogram every year.  Breast cancer gene (BRCA) assessment is recommended for women who have family members with BRCA-related cancers. BRCA-related cancers include:  Breast.  Ovarian.  Tubal.  Peritoneal cancers.  Results of the assessment will determine the need for genetic counseling and BRCA1 and BRCA2 testing. Cervical Cancer Your health care provider may recommend that you be screened regularly for cancer of the pelvic organs (ovaries, uterus, and  vagina). This screening involves a pelvic examination, including checking for microscopic changes to the surface of your cervix (Pap test). You may be encouraged to have this screening done every 3 years, beginning at age 21.  For women ages 30-65, health care providers may recommend pelvic exams and Pap testing every 3 years, or they may recommend the Pap and pelvic exam, combined with testing for human papilloma virus (HPV), every 5 years. Some types of HPV increase your risk of cervical cancer. Testing for HPV may also be done on women of any age with unclear Pap test results.  Other health care providers may not recommend any screening for nonpregnant women who are considered low risk for pelvic cancer and who do not have symptoms. Ask your health care provider if a screening pelvic exam is right for you.  If you have had past treatment for cervical cancer or a condition that could lead to cancer, you need Pap tests and screening for cancer for at least 20 years after your treatment. If Pap tests have been discontinued, your risk factors (such as having a new sexual partner) need to be reassessed to determine if screening should resume. Some women have medical problems that increase the chance of getting cervical cancer. In these cases, your health care provider may recommend more frequent screening and Pap tests. Colorectal Cancer  This type of cancer can be detected and often prevented.  Routine colorectal cancer screening usually begins at 46 years of age and continues through 46 years of age.  Your health care provider may recommend screening at an earlier age if you have risk factors for colon cancer.  Your health care provider may also recommend using home test kits to check for hidden blood in the stool.  A small camera at the end of a tube can be used to examine your colon directly (sigmoidoscopy or colonoscopy). This is done to check for the earliest forms of colorectal  cancer.  Routine screening usually begins at age 50.  Direct examination of the colon should be repeated every 5-10 years through 46 years of age. However, you may need to be screened more often if early forms of precancerous polyps or small growths are found. Skin Cancer  Check your skin from head to toe regularly.  Tell your health care provider about any new moles or changes in moles, especially if there is a change in a mole's shape or color.  Also tell your health care provider if you have a mole that is larger than the size of a pencil eraser.  Always use sunscreen. Apply sunscreen liberally and repeatedly throughout the day.  Protect yourself by wearing long sleeves, pants, a wide-brimmed hat, and sunglasses whenever you are outside. HEART DISEASE, DIABETES, AND HIGH BLOOD PRESSURE   High blood pressure causes heart disease and increases the risk of stroke. High blood pressure is more likely to develop in:  People who have blood pressure in the high end   of the normal range (130-139/85-89 mm Hg).  People who are overweight or obese.  People who are African American.  If you are 38-23 years of age, have your blood pressure checked every 3-5 years. If you are 61 years of age or older, have your blood pressure checked every year. You should have your blood pressure measured twice--once when you are at a hospital or clinic, and once when you are not at a hospital or clinic. Record the average of the two measurements. To check your blood pressure when you are not at a hospital or clinic, you can use:  An automated blood pressure machine at a pharmacy.  A home blood pressure monitor.  If you are between 45 years and 39 years old, ask your health care provider if you should take aspirin to prevent strokes.  Have regular diabetes screenings. This involves taking a blood sample to check your fasting blood sugar level.  If you are at a normal weight and have a low risk for diabetes,  have this test once every three years after 46 years of age.  If you are overweight and have a high risk for diabetes, consider being tested at a younger age or more often. PREVENTING INFECTION  Hepatitis B  If you have a higher risk for hepatitis B, you should be screened for this virus. You are considered at high risk for hepatitis B if:  You were born in a country where hepatitis B is common. Ask your health care provider which countries are considered high risk.  Your parents were born in a high-risk country, and you have not been immunized against hepatitis B (hepatitis B vaccine).  You have HIV or AIDS.  You use needles to inject street drugs.  You live with someone who has hepatitis B.  You have had sex with someone who has hepatitis B.  You get hemodialysis treatment.  You take certain medicines for conditions, including cancer, organ transplantation, and autoimmune conditions. Hepatitis C  Blood testing is recommended for:  Everyone born from [birth year] through 1967.  Anyone with known risk factors for hepatitis C. Sexually transmitted infections (STIs)  You should be screened for sexually transmitted infections (STIs) including gonorrhea and chlamydia if:  You are sexually active and are younger than 46 years of age.  You are older than 46 years of age and your health care provider tells you that you are at risk for this type of infection.  Your sexual activity has changed since you were last screened and you are at an increased risk for chlamydia or gonorrhea. Ask your health care provider if you are at risk.  If you do not have HIV, but are at risk, it may be recommended that you take a prescription medicine daily to prevent HIV infection. This is called pre-exposure prophylaxis (PrEP). You are considered at risk if:  You are sexually active and do not regularly use condoms or know the HIV status of your partner(s).  You take drugs by injection.  You are sexually  active with a partner who has HIV. Talk with your health care provider about whether you are at high risk of being infected with HIV. If you choose to begin PrEP, you should first be tested for HIV. You should then be tested every 3 months for as long as you are taking PrEP.  PREGNANCY   If you are premenopausal and you may become pregnant, ask your health care provider about preconception counseling.  If you may  become pregnant, take 400 to 800 micrograms (mcg) of folic acid every day.  If you want to prevent pregnancy, talk to your health care provider about birth control (contraception). OSTEOPOROSIS AND MENOPAUSE   Osteoporosis is a disease in which the bones lose minerals and strength with aging. This can result in serious bone fractures. Your risk for osteoporosis can be identified using a bone density scan.  If you are 61 years of age or older, or if you are at risk for osteoporosis and fractures, ask your health care provider if you should be screened.  Ask your health care provider whether you should take a calcium or vitamin D supplement to lower your risk for osteoporosis.  Menopause may have certain physical symptoms and risks.  Hormone replacement therapy may reduce some of these symptoms and risks. Talk to your health care provider about whether hormone replacement therapy is right for you.  HOME CARE INSTRUCTIONS   Schedule regular health, dental, and eye exams.  Stay current with your immunizations.   Do not use any tobacco products including cigarettes, chewing tobacco, or electronic cigarettes.  If you are pregnant, do not drink alcohol.  If you are breastfeeding, limit how much and how often you drink alcohol.  Limit alcohol intake to no more than 1 drink per day for nonpregnant women. One drink equals 12 ounces of beer, 5 ounces of wine, or 1 ounces of hard liquor.  Do not use street drugs.  Do not share needles.  Ask your health care provider for help if  you need support or information about quitting drugs.  Tell your health care provider if you often feel depressed.  Tell your health care provider if you have ever been abused or do not feel safe at home.   This information is not intended to replace advice given to you by your health care provider. Make sure you discuss any questions you have with your health care provider.   Document Released: 07/19/2010 Document Revised: 01/24/2014 Document Reviewed: 12/05/2012 Elsevier Interactive Patient Education [employer].

## 2014-12-08 ENCOUNTER — Other Ambulatory Visit: Payer: Self-pay

## 2014-12-08 DIAGNOSIS — R519 Headache, unspecified: Secondary | ICD-10-CM

## 2014-12-08 DIAGNOSIS — R51 Headache: Principal | ICD-10-CM

## 2014-12-08 MED ORDER — PROMETHAZINE HCL 25 MG PO TABS: 25.0000 mg | ORAL_TABLET | Freq: Four times a day (QID) | ORAL | Status: DC | PRN

## 2015-02-09 ENCOUNTER — Other Ambulatory Visit: Payer: Self-pay

## 2015-02-09 DIAGNOSIS — Z3041 Encounter for surveillance of contraceptive pills: Secondary | ICD-10-CM

## 2015-02-09 MED ORDER — NORETHIN ACE-ETH ESTRAD-FE 1-20 MG-MCG PO TABS: ORAL_TABLET | ORAL | Status: DC

## 2015-02-25 ENCOUNTER — Other Ambulatory Visit: Payer: Self-pay

## 2015-02-25 DIAGNOSIS — Z1231 Encounter for screening mammogram for malignant neoplasm of breast: Secondary | ICD-10-CM

## 2015-03-13 ENCOUNTER — Ambulatory Visit
Admission: RE | Admit: 2015-03-13 | Discharge: 2015-03-13 | Disposition: A | Discharge status: Home / Self Care | Payer: BLUE CROSS/BLUE SHIELD | Source: Ambulatory Visit

## 2015-03-13 DIAGNOSIS — Z1231 Encounter for screening mammogram for malignant neoplasm of breast: Secondary | ICD-10-CM

## 2015-04-15 ENCOUNTER — Ambulatory Visit (INDEPENDENT_AMBULATORY_CARE_PROVIDER_SITE_OTHER): Payer: BLUE CROSS/BLUE SHIELD | Admitting: Women's Health

## 2015-04-15 ENCOUNTER — Telehealth: Payer: Self-pay | Admitting: *Deleted

## 2015-04-15 ENCOUNTER — Encounter: Payer: Self-pay | Admitting: Women's Health

## 2015-04-15 VITALS — BP 126/78 | Ht 62.0 in | Wt 131.0 lb

## 2015-04-15 DIAGNOSIS — N898 Other specified noninflammatory disorders of vagina: Secondary | ICD-10-CM | POA: Diagnosis not present

## 2015-04-15 DIAGNOSIS — B373 Candidiasis of vulva and vagina: Secondary | ICD-10-CM

## 2015-04-15 DIAGNOSIS — B3731 Acute candidiasis of vulva and vagina: Secondary | ICD-10-CM

## 2015-04-15 DIAGNOSIS — R350 Frequency of micturition: Secondary | ICD-10-CM

## 2015-04-15 LAB — URINALYSIS W MICROSCOPIC + REFLEX CULTURE
BILIRUBIN URINE: NEGATIVE
CASTS: NONE SEEN [LPF]
CRYSTALS: NONE SEEN [HPF]
Glucose, UA: NEGATIVE
KETONES UR: NEGATIVE
Leukocytes, UA: NEGATIVE
Nitrite: NEGATIVE
PROTEIN: NEGATIVE
Specific Gravity, Urine: <1.005 (ref 1.001–1.035)
Yeast: NONE SEEN [HPF]
pH: 6.5 (ref 5.0–8.0)

## 2015-04-15 LAB — WET PREP FOR TRICH, YEAST, CLUE
CLUE CELLS WET PREP: NONE SEEN
Trich, Wet Prep: NONE SEEN

## 2015-04-15 MED ORDER — FLUCONAZOLE 150 MG PO TABS: 150.0000 mg | ORAL_TABLET | Freq: Once | ORAL | Status: DC

## 2015-04-15 NOTE — Telephone Encounter (Signed)
Pt called c/o bleeding x 2 weeks, had some white discharge and itching that went away, bleeding is not heavy, but is daily. Pt advise to schedule for exam, pt transferred to front desk

## 2015-04-15 NOTE — Patient Instructions (Signed)
sonohysterogram if spotting continues after stopping pills for 4 days.  Dr Toney Rakes will do sonohysterogram Monilial Vaginitis Vaginitis in a soreness, swelling and redness (inflammation) of the vagina and vulva. Monilial vaginitis is not a sexually transmitted infection. CAUSES  Yeast vaginitis is caused by yeast (candida) that is normally found in your vagina. With a yeast infection, the candida has overgrown in number to a point that upsets the chemical balance. SYMPTOMS   White, thick vaginal discharge.  Swelling, itching, redness and irritation of the vagina and possibly the lips of the vagina (vulva).  Burning or painful urination.  Painful intercourse. DIAGNOSIS  Things that may contribute to monilial vaginitis are:  Postmenopausal and virginal states.  Pregnancy.  Infections.  Being tired, sick or stressed, especially if you had monilial vaginitis in the past.  Diabetes. Good control will help lower the chance.  Birth control pills.  Tight fitting garments.  Using bubble bath, feminine sprays, douches or deodorant tampons.  Taking certain medications that kill germs (antibiotics).  Sporadic recurrence can occur if you become ill. TREATMENT  Your caregiver will give you medication.  There are several kinds of anti monilial vaginal creams and suppositories specific for monilial vaginitis. For recurrent yeast infections, use a suppository or cream in the vagina 2 times a week, or as directed.  Anti-monilial or steroid cream for the itching or irritation of the vulva may also be used. Get your caregiver's permission.  Painting the vagina with methylene blue solution may help if the monilial cream does not work.  Eating yogurt may help prevent monilial vaginitis. HOME CARE INSTRUCTIONS   Finish all medication as prescribed.  Do not have sex until treatment is completed or after your caregiver tells you it is okay.  Take warm sitz baths.  Do not douche.  Do  not use tampons, especially scented ones.  Wear cotton underwear.  Avoid tight pants and panty hose.  Tell your sexual partner that you have a yeast infection. They should go to their caregiver if they have symptoms such as mild rash or itching.  Your sexual partner should be treated as well if your infection is difficult to eliminate.  Practice safer sex. Use condoms.  Some vaginal medications cause latex condoms to fail. Vaginal medications that harm condoms are:  Cleocin cream.  Butoconazole (Femstat).  Terconazole (Terazol) vaginal suppository.  Miconazole (Monistat) (may be purchased over the counter). SEEK MEDICAL CARE IF:   You have a temperature by mouth above 102 F (38.9 C).  The infection is getting worse after 2 days of treatment.  The infection is not getting better after 3 days of treatment.  You develop blisters in or around your vagina.  You develop vaginal bleeding, and it is not your menstrual period.  You have pain when you urinate.  You develop intestinal problems.  You have pain with sexual intercourse.   This information is not intended to replace advice given to you by your health care provider. Make sure you discuss any questions you have with your health care provider.   Document Released: 10/13/2004 Document Revised: 03/28/2011 Document Reviewed: 07/07/2014 Elsevier Interactive Patient Education [employer].

## 2015-04-15 NOTE — Progress Notes (Signed)
Patient ID: Carrie Jensen, female   DOB: 03-Oct-1970, 45 y.o.   MRN: [id number] Presents with complaint of irregular bleeding/spotting for 2 weeks. Takes Loestrin continuously for 3 months, no missed pills, did have a cycle in early February. History of endometriosis/dysmenorrhea. Having mild vaginal itching without odor. Denies abdominal pain, fever or urinary symptoms. Under increased stress, job responsibility change at work.  Exam: Appears well. Abdomen soft nontender, external genitalia within normal limits, speculum exam cervix status post LEEP, fleshy in appearance, scant brown discharge, wet prep positive for  yeast. Bimanual no CMT or adnexal tenderness. UA: Trace blood, 0-5 WBCs, 3-10 RBCs, moderate bacteria, 6-10 squamous epithelials  Irregular cycle on extended OCs Yeast vaginitis  Plan: Stop OCs for 4 days, have cycle, if irregular spotting/bleeding continues call and will schedule sonohysterogram with Dr. Toney Rakes. Continue Loestrin 1/20 daily, stop for 4 days if spotting occurs, then resume. Diflucan 150 by mouth 1 dose with refill. Yeast  prevention discussed. Urine culture pending.

## 2015-04-17 LAB — URINE CULTURE

## 2015-05-19 ENCOUNTER — Other Ambulatory Visit: Payer: Self-pay

## 2015-05-19 DIAGNOSIS — Z3041 Encounter for surveillance of contraceptive pills: Secondary | ICD-10-CM

## 2015-05-19 MED ORDER — NORETHIN ACE-ETH ESTRAD-FE 1-20 MG-MCG PO TABS: ORAL_TABLET | ORAL | Status: DC

## 2015-07-28 ENCOUNTER — Telehealth: Payer: Self-pay

## 2015-07-28 DIAGNOSIS — Z3041 Encounter for surveillance of contraceptive pills: Secondary | ICD-10-CM

## 2015-07-28 MED ORDER — NORETHIN ACE-ETH ESTRAD-FE 1-20 MG-MCG PO TABS: ORAL_TABLET | ORAL | Status: DC

## 2015-07-28 NOTE — Telephone Encounter (Signed)
Pharmacy sent message. "Carrie Jensen indicates that she takes only active pills. Please re-write Rx for #21 or indicate on script that she is to skip the inactive/placebo pills for ins purposes".  Corrected Rx sent.

## 2015-10-22 DIAGNOSIS — H524 Presbyopia: Secondary | ICD-10-CM | POA: Diagnosis not present

## 2015-10-22 DIAGNOSIS — H1131 Conjunctival hemorrhage, right eye: Secondary | ICD-10-CM | POA: Diagnosis not present

## 2015-10-22 DIAGNOSIS — H5213 Myopia, bilateral: Secondary | ICD-10-CM | POA: Diagnosis not present

## 2015-10-22 DIAGNOSIS — H52223 Regular astigmatism, bilateral: Secondary | ICD-10-CM | POA: Diagnosis not present

## 2015-11-10 DIAGNOSIS — A084 Viral intestinal infection, unspecified: Secondary | ICD-10-CM | POA: Diagnosis not present

## 2015-12-08 ENCOUNTER — Other Ambulatory Visit: Payer: Self-pay | Admitting: Women's Health

## 2015-12-08 ENCOUNTER — Encounter: Payer: Self-pay | Admitting: Women's Health

## 2015-12-08 ENCOUNTER — Ambulatory Visit (INDEPENDENT_AMBULATORY_CARE_PROVIDER_SITE_OTHER): Payer: BLUE CROSS/BLUE SHIELD | Admitting: Women's Health

## 2015-12-08 VITALS — BP 126/78 | Ht 62.0 in | Wt 134.0 lb

## 2015-12-08 DIAGNOSIS — F3289 Other specified depressive episodes: Secondary | ICD-10-CM

## 2015-12-08 DIAGNOSIS — R51 Headache: Secondary | ICD-10-CM | POA: Diagnosis not present

## 2015-12-08 DIAGNOSIS — R519 Headache, unspecified: Secondary | ICD-10-CM

## 2015-12-08 DIAGNOSIS — Z01419 Encounter for gynecological examination (general) (routine) without abnormal findings: Secondary | ICD-10-CM | POA: Diagnosis not present

## 2015-12-08 DIAGNOSIS — Z3041 Encounter for surveillance of contraceptive pills: Secondary | ICD-10-CM | POA: Diagnosis not present

## 2015-12-08 DIAGNOSIS — F410 Panic disorder [episodic paroxysmal anxiety] without agoraphobia: Secondary | ICD-10-CM

## 2015-12-08 DIAGNOSIS — Z1329 Encounter for screening for other suspected endocrine disorder: Secondary | ICD-10-CM | POA: Diagnosis not present

## 2015-12-08 LAB — COMPREHENSIVE METABOLIC PANEL
ALT: 6 U/L (ref 6–29)
AST: 18 U/L (ref 10–35)
Albumin: 3.7 g/dL (ref 3.6–5.1)
Alkaline Phosphatase: 46 U/L (ref 33–115)
BUN: 16 mg/dL (ref 7–25)
CALCIUM: 8.8 mg/dL (ref 8.6–10.2)
CO2: 24 mmol/L (ref 20–31)
Chloride: 105 mmol/L (ref 98–110)
Creat: 0.75 mg/dL (ref 0.50–1.10)
GLUCOSE: 78 mg/dL (ref 65–99)
POTASSIUM: 3.8 mmol/L (ref 3.5–5.3)
Sodium: 138 mmol/L (ref 135–146)
Total Bilirubin: 0.6 mg/dL (ref 0.2–1.2)
Total Protein: 6.2 g/dL (ref 6.1–8.1)

## 2015-12-08 LAB — CBC WITH DIFFERENTIAL/PLATELET
BASOS PCT: 0 %
Basophils Absolute: 0 cells/uL (ref 0–200)
Eosinophils Absolute: 74 cells/uL (ref 15–500)
Eosinophils Relative: 1 %
HEMATOCRIT: 39.9 % (ref 35.0–45.0)
Hemoglobin: 13.3 g/dL (ref 11.7–15.5)
LYMPHS PCT: 45 %
Lymphs Abs: 3330 cells/uL (ref 850–3900)
MCH: 32.5 pg (ref 27.0–33.0)
MCHC: 33.3 g/dL (ref 32.0–36.0)
MCV: 97.6 fL (ref 80.0–100.0)
MONO ABS: 444 {cells}/uL (ref 200–950)
MONOS PCT: 6 %
MPV: 8.6 fL (ref 7.5–12.5)
NEUTROS PCT: 48 %
Neutro Abs: 3552 cells/uL (ref 1500–7800)
PLATELETS: 301 10*3/uL (ref 140–400)
RBC: 4.09 MIL/uL (ref 3.80–5.10)
RDW: 12.3 % (ref 11.0–15.0)
WBC: 7.4 10*3/uL (ref 3.8–10.8)

## 2015-12-08 LAB — T3 UPTAKE: T3 UPTAKE: 25 % (ref 22–35)

## 2015-12-08 LAB — T4: T4 TOTAL: 7.9 ug/dL (ref 4.5–12.0)

## 2015-12-08 LAB — TSH: TSH: 0.36 mIU/L — ABNORMAL LOW

## 2015-12-08 MED ORDER — VENLAFAXINE HCL ER 150 MG PO CP24: 150.0000 mg | ORAL_CAPSULE | Freq: Every day | ORAL | 4 refills | Status: DC

## 2015-12-08 MED ORDER — PROMETHAZINE HCL 25 MG PO TABS: 25.0000 mg | ORAL_TABLET | Freq: Four times a day (QID) | ORAL | 0 refills | Status: DC | PRN

## 2015-12-08 MED ORDER — NORETHIN ACE-ETH ESTRAD-FE 1-20 MG-MCG PO TABS: ORAL_TABLET | ORAL | 4 refills | Status: DC

## 2015-12-08 MED ORDER — ALPRAZOLAM 0.25 MG PO TABS: 0.2500 mg | ORAL_TABLET | Freq: Every evening | ORAL | 1 refills | Status: DC | PRN

## 2015-12-08 MED ORDER — SUMATRIPTAN SUCCINATE 50 MG PO TABS: 50.0000 mg | ORAL_TABLET | ORAL | 6 refills | Status: DC | PRN

## 2015-12-08 NOTE — Patient Instructions (Signed)

## 2015-12-08 NOTE — Progress Notes (Signed)
Carrie Jensen Oct 25, 1970 [id number]    History:    Presents for annual exam.  Was taking Loestrin continuously cycling every 3 months but had some spotting and now cycling monthly. 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 History of endometriosis. Normal mammogram history. History of migraines less frequent, uses occasional Imitrex. On Effexor 150 mg XL daily depression stable. Had a 23 pound weight loss and has maintained with diet and exercise.  Past medical history, past surgical history, family history and social history were all reviewed and documented in the EPIC chart. 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  ROS:  A ROS was performed and pertinent positives and negatives are included.  Exam:  Vitals:   12/08/15 1552  BP: 126/78  Weight: 134 lb (60.8 kg)  Height: 5\' 2"  (1.575 m)   Body mass index is 24.51 kg/m.   General appearance:  Normal Thyroid:  Symmetrical, normal in size, without palpable masses or nodularity. Respiratory  Auscultation:  Clear without wheezing or rhonchi Cardiovascular  Auscultation:  Regular rate, without rubs, murmurs or gallops  Edema/varicosities:  Not grossly evident Abdominal  Soft,nontender, without masses, guarding or rebound.  Liver/spleen:  No organomegaly noted  Hernia:  None appreciated  Skin  Inspection:  Grossly normal   Breasts: Examined lying and sitting.     Right: Without masses, retractions, discharge or axillary adenopathy.     Left: Without masses, retractions, discharge or axillary adenopathy. Gentitourinary   Inguinal/mons:  Normal without inguinal adenopathy  External genitalia:  Normal  BUS/Urethra/Skene's glands:  Normal  Vagina:  Normal  Cervix:  Normal  Uterus:   normal in size, shape and contour.  Midline and mobile  Adnexa/parametria:     Rt: Without masses or tenderness.   Lt: Without masses or tenderness.  Anus and perineum: Normal  Digital rectal exam: Normal sphincter tone without palpated masses or  tenderness  Assessment/Plan:  45 y.o. MWF G0 for annual exam with no complaints.  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 Instructed to take continuously until spotting, stop for 4 days for cycle. SBE's, continue annual mammogram 3-D tomography reviewed and encouraged history of dense breasts. Imitrex 50 mg migraines may repeat in 2 hours no more than 2 tablets in 24 hours, prescription, proper use given and reviewed. Xanax 0.25 reviewed addictive properties to use sparingly has had some panic type episodes, counseling as needed. Effexor 150 mg  XL prescription, proper use given and reviewed. Reviewed importance of continuing regular exercise, self-care, leisure activities. Counseling as needed has had in the past. CBC, CMP, vitamin D, TSH, T3, T4, UA, Pap normal with negative HR HPV 2015, new screening guidelines reviewed.  [name], 4:49 PM 12/08/2015

## 2015-12-09 LAB — URINALYSIS W MICROSCOPIC + REFLEX CULTURE
BACTERIA UA: NONE SEEN [HPF]
BILIRUBIN URINE: NEGATIVE
Casts: NONE SEEN [LPF]
Crystals: NONE SEEN [HPF]
Glucose, UA: NEGATIVE
Ketones, ur: NEGATIVE
Nitrite: NEGATIVE
PROTEIN: NEGATIVE
Specific Gravity, Urine: 1.015 (ref 1.001–1.035)
YEAST: NONE SEEN [HPF]
pH: 6 (ref 5.0–8.0)

## 2015-12-09 LAB — VITAMIN D 25 HYDROXY (VIT D DEFICIENCY, FRACTURES): Vit D, 25-Hydroxy: 59 ng/mL (ref 30–100)

## 2015-12-10 LAB — THYROID ANTIBODIES
THYROID PEROXIDASE ANTIBODY: 3 [IU]/mL (ref <9)
Thyroglobulin Ab: 1 IU/mL (ref <2)

## 2015-12-10 LAB — URINE CULTURE

## 2015-12-23 ENCOUNTER — Encounter: Payer: Self-pay | Admitting: *Deleted

## 2016-03-21 ENCOUNTER — Other Ambulatory Visit: Payer: Self-pay | Admitting: Women's Health

## 2016-03-21 DIAGNOSIS — Z1231 Encounter for screening mammogram for malignant neoplasm of breast: Secondary | ICD-10-CM

## 2016-04-07 ENCOUNTER — Ambulatory Visit: Payer: BLUE CROSS/BLUE SHIELD

## 2016-04-25 ENCOUNTER — Ambulatory Visit: Payer: BLUE CROSS/BLUE SHIELD

## 2016-04-25 ENCOUNTER — Ambulatory Visit
Admission: RE | Admit: 2016-04-25 | Discharge: 2016-04-25 | Disposition: A | Discharge status: Home / Self Care | Payer: BLUE CROSS/BLUE SHIELD | Source: Ambulatory Visit | Attending: Women's Health | Admitting: Women's Health

## 2016-04-25 DIAGNOSIS — Z1231 Encounter for screening mammogram for malignant neoplasm of breast: Secondary | ICD-10-CM | POA: Diagnosis not present

## 2016-05-02 DIAGNOSIS — D485 Neoplasm of uncertain behavior of skin: Secondary | ICD-10-CM | POA: Diagnosis not present

## 2016-05-10 DIAGNOSIS — C4491 Basal cell carcinoma of skin, unspecified: Secondary | ICD-10-CM | POA: Diagnosis not present

## 2016-05-16 DIAGNOSIS — C4491 Basal cell carcinoma of skin, unspecified: Secondary | ICD-10-CM | POA: Diagnosis not present

## 2016-06-01 ENCOUNTER — Encounter: Payer: Self-pay | Admitting: Gynecology

## 2016-06-12 DIAGNOSIS — R197 Diarrhea, unspecified: Secondary | ICD-10-CM | POA: Diagnosis not present

## 2016-06-12 DIAGNOSIS — N911 Secondary amenorrhea: Secondary | ICD-10-CM | POA: Diagnosis not present

## 2016-06-12 DIAGNOSIS — R1084 Generalized abdominal pain: Secondary | ICD-10-CM | POA: Diagnosis not present

## 2016-06-12 DIAGNOSIS — K529 Noninfective gastroenteritis and colitis, unspecified: Secondary | ICD-10-CM | POA: Diagnosis not present

## 2016-06-13 DIAGNOSIS — R1084 Generalized abdominal pain: Secondary | ICD-10-CM | POA: Diagnosis not present

## 2016-06-18 DIAGNOSIS — K581 Irritable bowel syndrome with constipation: Secondary | ICD-10-CM | POA: Diagnosis not present

## 2016-06-18 DIAGNOSIS — K529 Noninfective gastroenteritis and colitis, unspecified: Secondary | ICD-10-CM | POA: Diagnosis not present

## 2016-07-05 DIAGNOSIS — K581 Irritable bowel syndrome with constipation: Secondary | ICD-10-CM | POA: Diagnosis not present

## 2016-07-05 DIAGNOSIS — R197 Diarrhea, unspecified: Secondary | ICD-10-CM | POA: Diagnosis not present

## 2016-08-03 DIAGNOSIS — C44319 Basal cell carcinoma of skin of other parts of face: Secondary | ICD-10-CM | POA: Diagnosis not present

## 2016-09-09 DIAGNOSIS — K219 Gastro-esophageal reflux disease without esophagitis: Secondary | ICD-10-CM | POA: Diagnosis not present

## 2016-09-09 DIAGNOSIS — B369 Superficial mycosis, unspecified: Secondary | ICD-10-CM | POA: Diagnosis not present

## 2016-09-09 DIAGNOSIS — F329 Major depressive disorder, single episode, unspecified: Secondary | ICD-10-CM | POA: Diagnosis not present

## 2016-10-05 DIAGNOSIS — K581 Irritable bowel syndrome with constipation: Secondary | ICD-10-CM | POA: Diagnosis not present

## 2016-10-05 DIAGNOSIS — Z79899 Other long term (current) drug therapy: Secondary | ICD-10-CM | POA: Diagnosis not present

## 2016-10-26 DIAGNOSIS — H52223 Regular astigmatism, bilateral: Secondary | ICD-10-CM | POA: Diagnosis not present

## 2016-10-26 DIAGNOSIS — H524 Presbyopia: Secondary | ICD-10-CM | POA: Diagnosis not present

## 2016-10-26 DIAGNOSIS — H5213 Myopia, bilateral: Secondary | ICD-10-CM | POA: Diagnosis not present

## 2016-10-31 ENCOUNTER — Encounter (INDEPENDENT_AMBULATORY_CARE_PROVIDER_SITE_OTHER): Payer: BLUE CROSS/BLUE SHIELD | Admitting: Ophthalmology

## 2016-11-04 ENCOUNTER — Encounter (INDEPENDENT_AMBULATORY_CARE_PROVIDER_SITE_OTHER): Payer: BLUE CROSS/BLUE SHIELD | Admitting: Ophthalmology

## 2016-11-04 DIAGNOSIS — H43813 Vitreous degeneration, bilateral: Secondary | ICD-10-CM | POA: Diagnosis not present

## 2016-11-04 DIAGNOSIS — H2511 Age-related nuclear cataract, right eye: Secondary | ICD-10-CM

## 2016-11-04 DIAGNOSIS — D3131 Benign neoplasm of right choroid: Secondary | ICD-10-CM

## 2016-12-07 DIAGNOSIS — K581 Irritable bowel syndrome with constipation: Secondary | ICD-10-CM | POA: Diagnosis not present

## 2016-12-09 DIAGNOSIS — D3131 Benign neoplasm of right choroid: Secondary | ICD-10-CM | POA: Diagnosis not present

## 2016-12-12 ENCOUNTER — Other Ambulatory Visit: Payer: Self-pay | Admitting: Women's Health

## 2016-12-12 DIAGNOSIS — F3289 Other specified depressive episodes: Secondary | ICD-10-CM

## 2016-12-21 ENCOUNTER — Other Ambulatory Visit: Payer: Self-pay | Admitting: Women's Health

## 2016-12-21 DIAGNOSIS — Z3041 Encounter for surveillance of contraceptive pills: Secondary | ICD-10-CM

## 2016-12-28 ENCOUNTER — Encounter: Payer: Self-pay | Admitting: Women's Health

## 2016-12-28 ENCOUNTER — Ambulatory Visit (INDEPENDENT_AMBULATORY_CARE_PROVIDER_SITE_OTHER): Payer: BLUE CROSS/BLUE SHIELD | Admitting: Women's Health

## 2016-12-28 VITALS — BP 118/78 | Ht 63.0 in | Wt 138.0 lb

## 2016-12-28 DIAGNOSIS — Z01419 Encounter for gynecological examination (general) (routine) without abnormal findings: Secondary | ICD-10-CM

## 2016-12-28 DIAGNOSIS — R51 Headache: Secondary | ICD-10-CM

## 2016-12-28 DIAGNOSIS — Z13220 Encounter for screening for lipoid disorders: Secondary | ICD-10-CM | POA: Diagnosis not present

## 2016-12-28 DIAGNOSIS — F410 Panic disorder [episodic paroxysmal anxiety] without agoraphobia: Secondary | ICD-10-CM | POA: Diagnosis not present

## 2016-12-28 DIAGNOSIS — F3289 Other specified depressive episodes: Secondary | ICD-10-CM

## 2016-12-28 DIAGNOSIS — Z3041 Encounter for surveillance of contraceptive pills: Secondary | ICD-10-CM

## 2016-12-28 DIAGNOSIS — R87610 Atypical squamous cells of undetermined significance on cytologic smear of cervix (ASC-US): Secondary | ICD-10-CM | POA: Diagnosis not present

## 2016-12-28 DIAGNOSIS — R519 Headache, unspecified: Secondary | ICD-10-CM

## 2016-12-28 LAB — LIPID PANEL
Cholesterol: 208 mg/dL — ABNORMAL HIGH (ref <200)
HDL: 74 mg/dL (ref >50)
LDL CHOLESTEROL (CALC): 111 mg/dL — AB
NON-HDL CHOLESTEROL (CALC): 134 mg/dL — AB (ref <130)
TRIGLYCERIDES: 118 mg/dL (ref <150)
Total CHOL/HDL Ratio: 2.8 (calc) (ref <5.0)

## 2016-12-28 LAB — CBC WITH DIFFERENTIAL/PLATELET
BASOS PCT: 0.3 %
Basophils Absolute: 23 cells/uL (ref 0–200)
EOS PCT: 0.6 %
Eosinophils Absolute: 47 cells/uL (ref 15–500)
HEMATOCRIT: 44 % (ref 35.0–45.0)
HEMOGLOBIN: 15.2 g/dL (ref 11.7–15.5)
LYMPHS ABS: 2816 {cells}/uL (ref 850–3900)
MCH: 33.2 pg — ABNORMAL HIGH (ref 27.0–33.0)
MCHC: 34.5 g/dL (ref 32.0–36.0)
MCV: 96.1 fL (ref 80.0–100.0)
MPV: 8.7 fL (ref 7.5–12.5)
Monocytes Relative: 6 %
NEUTROS ABS: 4446 {cells}/uL (ref 1500–7800)
Neutrophils Relative %: 57 %
Platelets: 405 10*3/uL — ABNORMAL HIGH (ref 140–400)
RBC: 4.58 10*6/uL (ref 3.80–5.10)
RDW: 11.6 % (ref 11.0–15.0)
Total Lymphocyte: 36.1 %
WBC: 7.8 10*3/uL (ref 3.8–10.8)
WBCMIX: 468 {cells}/uL (ref 200–950)

## 2016-12-28 MED ORDER — VENLAFAXINE HCL ER 150 MG PO CP24: ORAL_CAPSULE | ORAL | 4 refills | Status: DC

## 2016-12-28 MED ORDER — SUMATRIPTAN SUCCINATE 50 MG PO TABS: 50.0000 mg | ORAL_TABLET | ORAL | 6 refills | Status: DC | PRN

## 2016-12-28 MED ORDER — NORETHIN ACE-ETH ESTRAD-FE 1-20 MG-MCG PO TABS: ORAL_TABLET | ORAL | 4 refills | Status: DC

## 2016-12-28 MED ORDER — ALPRAZOLAM 0.25 MG PO TABS: 0.2500 mg | ORAL_TABLET | Freq: Every evening | ORAL | 1 refills | Status: DC | PRN

## 2016-12-28 NOTE — Progress Notes (Signed)
Carrie Jensen [birth year] [id number]    History:    Presents for annual exam.  Loestrin 120 continuously amenorrheic. 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 History of endometriosis. Normal mammogram history. Basal cell cancer this past year Mohs procedure. Started on Linzess for IBS  this past year doing better. Anxiety stable on Effexor rare Xanax use. Migraine history, frequency decreasing.  Past medical history, past surgical history, family history and social history were all reviewed and documented in the EPIC chart. Nurse in long-term care.  ROS:  A ROS was performed and pertinent positives and negatives are included.  Exam:  Vitals:   12/28/16 1211  BP: 118/78  Weight: 138 lb (62.6 kg)  Height: 5\' 3"  (1.6 m)   Body mass index is 24.45 kg/m.   General appearance:  Normal Thyroid:  Symmetrical, normal in size, without palpable masses or nodularity. Respiratory  Auscultation:  Clear without wheezing or rhonchi Cardiovascular  Auscultation:  Regular rate, without rubs, murmurs or gallops  Edema/varicosities:  Not grossly evident Abdominal  Soft,nontender, without masses, guarding or rebound.  Liver/spleen:  No organomegaly noted  Hernia:  None appreciated  Skin  Inspection:  Grossly normal   Breasts: Examined lying and sitting.     Right: Without masses, retractions, discharge or axillary adenopathy.     Left: Without masses, retractions, discharge or axillary adenopathy. Gentitourinary   Inguinal/mons:  Normal without inguinal adenopathy  External genitalia:  Normal  BUS/Urethra/Skene's glands:  Normal  Vagina:  Normal  Cervix:  Normal  Uterus:   normal in size, shape and contour.  Midline and mobile  Adnexa/parametria:     Rt: Without masses or tenderness.   Lt: Without masses or tenderness.  Anus and perineum: Normal  Digital rectal exam: Normal sphincter tone without palpated masses or tenderness  Assessment/Plan:  46 y.o. MWF G0 for annual exam with no  complaints.  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 Effexor 150 mg by mouth daily prescription, proper use, importance of self-care reviewed. Xanax 0.25 prescription given aware addictive properties to use sparingly. Imitrex 50 mg with migraines, to use as needed. SBE's, exercise, calcium rich diet, vitamin D 1000 daily encouraged. Continue healthy lifestyle of healthy diet and regular exercise has maintained weight loss. CBC,  lipid panel, Pap with HR HPV typing.    [place], 1:14 PM 12/28/2016

## 2016-12-28 NOTE — Progress Notes (Signed)
[id number]  

## 2016-12-28 NOTE — Patient Instructions (Signed)
Health Maintenance for Postmenopausal Women Menopause is a normal process in which your reproductive ability comes to an end. This process happens gradually over a span of months to years, usually between the ages of 22 and 9. Menopause is complete when you have missed 12 consecutive menstrual periods. It is important to talk with your health care provider about some of the most common conditions that affect postmenopausal women, such as heart disease, cancer, and bone loss (osteoporosis). Adopting a healthy lifestyle and getting preventive care can help to promote your health and wellness. Those actions can also lower your chances of developing some of these common conditions. What should I know about menopause? During menopause, you may experience a number of symptoms, such as:  Moderate-to-severe hot flashes.  Night sweats.  Decrease in sex drive.  Mood swings.  Headaches.  Tiredness.  Irritability.  Memory problems.  Insomnia.  Choosing to treat or not to treat menopausal changes is an individual decision that you make with your health care provider. What should I know about hormone replacement therapy and supplements? Hormone therapy products are effective for treating symptoms that are associated with menopause, such as hot flashes and night sweats. Hormone replacement carries certain risks, especially as you become older. If you are thinking about using estrogen or estrogen with progestin treatments, discuss the benefits and risks with your health care provider. What should I know about heart disease and stroke? Heart disease, heart attack, and stroke become more likely as you age. This may be due, in part, to the hormonal changes that your body experiences during menopause. These can affect how your body processes dietary fats, triglycerides, and cholesterol. Heart attack and stroke are both medical emergencies. There are many things that you can do to help prevent heart disease  and stroke:  Have your blood pressure checked at least every 1-2 years. High blood pressure causes heart disease and increases the risk of stroke.  If you are 53-22 years old, ask your health care provider if you should take aspirin to prevent a heart attack or a stroke.  Do not use any tobacco products, including cigarettes, chewing tobacco, or electronic cigarettes. If you need help quitting, ask your health care provider.  It is important to eat a healthy diet and maintain a healthy weight. ? Be sure to include plenty of vegetables, fruits, low-fat dairy products, and lean protein. ? Avoid eating foods that are high in solid fats, added sugars, or salt (sodium).  Get regular exercise. This is one of the most important things that you can do for your health. ? Try to exercise for at least 150 minutes each week. The type of exercise that you do should increase your heart rate and make you sweat. This is known as moderate-intensity exercise. ? Try to do strengthening exercises at least twice each week. Do these in addition to the moderate-intensity exercise.  Know your numbers.Ask your health care provider to check your cholesterol and your blood glucose. Continue to have your blood tested as directed by your health care provider.  What should I know about cancer screening? There are several types of cancer. Take the following steps to reduce your risk and to catch any cancer development as early as possible. Breast Cancer  Practice breast self-awareness. ? This means understanding how your breasts normally appear and feel. ? It also means doing regular breast self-exams. Let your health care provider know about any changes, no matter how small.  If you are 40  or older, have a clinician do a breast exam (clinical breast exam or CBE) every year. Depending on your age, family history, and medical history, it may be recommended that you also have a yearly breast X-ray (mammogram).  If you  have a family history of breast cancer, talk with your health care provider about genetic screening.  If you are at high risk for breast cancer, talk with your health care provider about having an MRI and a mammogram every year.  Breast cancer (BRCA) gene test is recommended for women who have family members with BRCA-related cancers. Results of the assessment will determine the need for genetic counseling and BRCA1 and for BRCA2 testing. BRCA-related cancers include these types: ? Breast. This occurs in males or females. ? Ovarian. ? Tubal. This may also be called fallopian tube cancer. ? Cancer of the abdominal or pelvic lining (peritoneal cancer). ? Prostate. ? Pancreatic.  Cervical, Uterine, and Ovarian Cancer Your health care provider may recommend that you be screened regularly for cancer of the pelvic organs. These include your ovaries, uterus, and vagina. This screening involves a pelvic exam, which includes checking for microscopic changes to the surface of your cervix (Pap test).  For women ages 21-65, health care providers may recommend a pelvic exam and a Pap test every three years. For women ages 79-65, they may recommend the Pap test and pelvic exam, combined with testing for human papilloma virus (HPV), every five years. Some types of HPV increase your risk of cervical cancer. Testing for HPV may also be done on women of any age who have unclear Pap test results.  Other health care providers may not recommend any screening for nonpregnant women who are considered low risk for pelvic cancer and have no symptoms. Ask your health care provider if a screening pelvic exam is right for you.  If you have had past treatment for cervical cancer or a condition that could lead to cancer, you need Pap tests and screening for cancer for at least 20 years after your treatment. If Pap tests have been discontinued for you, your risk factors (such as having a new sexual partner) need to be  reassessed to determine if you should start having screenings again. Some women have medical problems that increase the chance of getting cervical cancer. In these cases, your health care provider may recommend that you have screening and Pap tests more often.  If you have a family history of uterine cancer or ovarian cancer, talk with your health care provider about genetic screening.  If you have vaginal bleeding after reaching menopause, tell your health care provider.  There are currently no reliable tests available to screen for ovarian cancer.  Lung Cancer Lung cancer screening is recommended for adults 69-62 years old who are at high risk for lung cancer because of a history of smoking. A yearly low-dose CT scan of the lungs is recommended if you:  Currently smoke.  Have a history of at least 30 pack-years of smoking and you currently smoke or have quit within the past 15 years. A pack-year is smoking an average of one pack of cigarettes per day for one year.  Yearly screening should:  Continue until it has been 15 years since you quit.  Stop if you develop a health problem that would prevent you from having lung cancer treatment.  Colorectal Cancer  This type of cancer can be detected and can often be prevented.  Routine colorectal cancer screening usually begins at  age 42 and continues through age 45.  If you have risk factors for colon cancer, your health care provider may recommend that you be screened at an earlier age.  If you have a family history of colorectal cancer, talk with your health care provider about genetic screening.  Your health care provider may also recommend using home test kits to check for hidden blood in your stool.  A small camera at the end of a tube can be used to examine your colon directly (sigmoidoscopy or colonoscopy). This is done to check for the earliest forms of colorectal cancer.  Direct examination of the colon should be repeated every  5-10 years until age 71. However, if early forms of precancerous polyps or small growths are found or if you have a family history or genetic risk for colorectal cancer, you may need to be screened more often.  Skin Cancer  Check your skin from head to toe regularly.  Monitor any moles. Be sure to tell your health care provider: ? About any new moles or changes in moles, especially if there is a change in a mole's shape or color. ? If you have a mole that is larger than the size of a pencil eraser.  If any of your family members has a history of skin cancer, especially at a Talaysia Pinheiro age, talk with your health care provider about genetic screening.  Always use sunscreen. Apply sunscreen liberally and repeatedly throughout the day.  Whenever you are outside, protect yourself by wearing long sleeves, pants, a wide-brimmed hat, and sunglasses.  What should I know about osteoporosis? Osteoporosis is a condition in which bone destruction happens more quickly than new bone creation. After menopause, you may be at an increased risk for osteoporosis. To help prevent osteoporosis or the bone fractures that can happen because of osteoporosis, the following is recommended:  If you are 46-71 years old, get at least 1,000 mg of calcium and at least 600 mg of vitamin D per day.  If you are older than age 55 but younger than age 65, get at least 1,200 mg of calcium and at least 600 mg of vitamin D per day.  If you are older than age 54, get at least 1,200 mg of calcium and at least 800 mg of vitamin D per day.  Smoking and excessive alcohol intake increase the risk of osteoporosis. Eat foods that are rich in calcium and vitamin D, and do weight-bearing exercises several times each week as directed by your health care provider. What should I know about how menopause affects my mental health? Depression may occur at any age, but it is more common as you become older. Common symptoms of depression  include:  Low or sad mood.  Changes in sleep patterns.  Changes in appetite or eating patterns.  Feeling an overall lack of motivation or enjoyment of activities that you previously enjoyed.  Frequent crying spells.  Talk with your health care provider if you think that you are experiencing depression. What should I know about immunizations? It is important that you get and maintain your immunizations. These include:  Tetanus, diphtheria, and pertussis (Tdap) booster vaccine.  Influenza every year before the flu season begins.  Pneumonia vaccine.  Shingles vaccine.  Your health care provider may also recommend other immunizations. This information is not intended to replace advice given to you by your health care provider. Make sure you discuss any questions you have with your health care provider. Document Released: 02/25/2005  Document Revised: 07/24/2015 Document Reviewed: 10/07/2014 Elsevier Interactive Patient Education  2018 [employer] Inc[employer]  

## 2016-12-29 LAB — PAP, TP IMAGING W/ HPV RNA, RFLX HPV TYPE 16,18/45: HPV DNA HIGH RISK: NOT DETECTED

## 2017-01-08 DIAGNOSIS — J029 Acute pharyngitis, unspecified: Secondary | ICD-10-CM | POA: Diagnosis not present

## 2017-02-08 DIAGNOSIS — K581 Irritable bowel syndrome with constipation: Secondary | ICD-10-CM | POA: Diagnosis not present

## 2017-03-16 ENCOUNTER — Other Ambulatory Visit: Payer: Self-pay | Admitting: Women's Health

## 2017-03-16 DIAGNOSIS — Z1231 Encounter for screening mammogram for malignant neoplasm of breast: Secondary | ICD-10-CM

## 2017-04-26 ENCOUNTER — Ambulatory Visit
Admission: RE | Admit: 2017-04-26 | Discharge: 2017-04-26 | Disposition: A | Discharge status: Home / Self Care | Payer: BLUE CROSS/BLUE SHIELD | Source: Ambulatory Visit | Attending: Women's Health | Admitting: Women's Health

## 2017-04-26 DIAGNOSIS — Z1231 Encounter for screening mammogram for malignant neoplasm of breast: Secondary | ICD-10-CM

## 2017-05-09 DIAGNOSIS — K581 Irritable bowel syndrome with constipation: Secondary | ICD-10-CM | POA: Diagnosis not present

## 2017-05-19 ENCOUNTER — Encounter (INDEPENDENT_AMBULATORY_CARE_PROVIDER_SITE_OTHER): Payer: BLUE CROSS/BLUE SHIELD | Admitting: Ophthalmology

## 2017-06-09 DIAGNOSIS — D3131 Benign neoplasm of right choroid: Secondary | ICD-10-CM | POA: Diagnosis not present

## 2017-08-02 DIAGNOSIS — K581 Irritable bowel syndrome with constipation: Secondary | ICD-10-CM | POA: Diagnosis not present

## 2017-08-02 DIAGNOSIS — Z79899 Other long term (current) drug therapy: Secondary | ICD-10-CM | POA: Diagnosis not present

## 2017-10-03 DIAGNOSIS — E559 Vitamin D deficiency, unspecified: Secondary | ICD-10-CM | POA: Diagnosis not present

## 2017-10-03 DIAGNOSIS — K581 Irritable bowel syndrome with constipation: Secondary | ICD-10-CM | POA: Diagnosis not present

## 2017-10-03 DIAGNOSIS — E782 Mixed hyperlipidemia: Secondary | ICD-10-CM | POA: Diagnosis not present

## 2018-01-01 ENCOUNTER — Encounter: Payer: Self-pay | Admitting: Women's Health

## 2018-01-01 ENCOUNTER — Ambulatory Visit (INDEPENDENT_AMBULATORY_CARE_PROVIDER_SITE_OTHER): Payer: BLUE CROSS/BLUE SHIELD | Admitting: Women's Health

## 2018-01-01 VITALS — BP 124/80 | Ht 62.0 in | Wt 143.0 lb

## 2018-01-01 DIAGNOSIS — Z01419 Encounter for gynecological examination (general) (routine) without abnormal findings: Secondary | ICD-10-CM

## 2018-01-01 DIAGNOSIS — Z3041 Encounter for surveillance of contraceptive pills: Secondary | ICD-10-CM

## 2018-01-01 DIAGNOSIS — R51 Headache: Secondary | ICD-10-CM

## 2018-01-01 DIAGNOSIS — R519 Headache, unspecified: Secondary | ICD-10-CM

## 2018-01-01 DIAGNOSIS — F3289 Other specified depressive episodes: Secondary | ICD-10-CM | POA: Diagnosis not present

## 2018-01-01 DIAGNOSIS — F410 Panic disorder [episodic paroxysmal anxiety] without agoraphobia: Secondary | ICD-10-CM

## 2018-01-01 MED ORDER — SUMATRIPTAN SUCCINATE 50 MG PO TABS: 50.0000 mg | ORAL_TABLET | ORAL | 6 refills | Status: DC | PRN

## 2018-01-01 MED ORDER — VENLAFAXINE HCL ER 150 MG PO CP24: ORAL_CAPSULE | ORAL | 4 refills | Status: DC

## 2018-01-01 MED ORDER — NORETHIN ACE-ETH ESTRAD-FE 1-20 MG-MCG PO TABS: ORAL_TABLET | ORAL | 4 refills | Status: DC

## 2018-01-01 MED ORDER — ALPRAZOLAM 0.25 MG PO TABS: 0.2500 mg | ORAL_TABLET | Freq: Every evening | ORAL | 1 refills | Status: DC | PRN

## 2018-01-01 NOTE — Progress Notes (Signed)
Carrie Jensen 05-Aug-1970 [id number]    History:    Presents for annual exam.  Amenorrheic on Loestrin continuously without complaint.  1998 CIN-2 LEEP with normal Paps after.  Normal mammogram history.  Anxiety and depression stable on Effexor uses rare Xanax.  Basal skin cancer has annual skin checks.  Migraines, less frequent.  Exercises most days.  History of hyperthyroidism-endocrinologist manages.  Chronic constipation on Linzess which is helping.  Past medical history, past surgical history, family history and social history were all reviewed and documented in the EPIC chart.  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  ROS:  A ROS was performed and pertinent positives and negatives are included.  Exam:  Vitals:   01/01/18 1558  BP: 124/80  Weight: 143 lb (64.9 kg)  Height: 5\' 2"  (1.575 m)   Body mass index is 26.16 kg/m.   General appearance:  Normal Thyroid:  Symmetrical, normal in size, without palpable masses or nodularity. Respiratory  Auscultation:  Clear without wheezing or rhonchi Cardiovascular  Auscultation:  Regular rate, without rubs, murmurs or gallops  Edema/varicosities:  Not grossly evident Abdominal  Soft,nontender, without masses, guarding or rebound.  Liver/spleen:  No organomegaly noted  Hernia:  None appreciated  Skin  Inspection:  Grossly normal   Breasts: Examined lying and sitting.     Right: Without masses, retractions, discharge or axillary adenopathy.     Left: Without masses, retractions, discharge or axillary adenopathy. Gentitourinary   Inguinal/mons:  Normal without inguinal adenopathy  External genitalia:  Normal  BUS/Urethra/Skene's glands:  Normal  Vagina:  Normal  Cervix:  Normal  Uterus:  normal in size, shape and contour.  Midline and mobile  Adnexa/parametria:     Rt: Without masses or tenderness.   Lt: Without masses or tenderness.  Anus and perineum: Normal  Digital rectal exam: Normal sphincter tone without palpated masses or  tenderness  Assessment/Plan:  47 y.o. MWF G0 for annual exam with no complaints.  Amenorrheic on Loestrin continuously 1998 CIN-2 with LEEP normal Paps after Anxiety/depression stable on Effexor, rare Xanax use Migraines without aura, decrease frequency Labs-primary care Chronic constipation-Linzess per GI  Plan: Loestrin 1/20 prescription, proper use given and reviewed slight risk for blood clots and strokes.  Imitrex 50 mg, prescription, proper use given and reviewed.  Effexor 150 mg daily prescription, proper use given and reviewed.  Reviewed importance of leisure, continued exercise, self-care.  Xanax 0.25  prescription, proper use, addictive properties reviewed encouraged to continue to use sparingly .  SBEs, continue annual screening mammogram, calcium rich foods, vitamin D 2000 daily encouraged.  Screening colonoscopy at 74 which is next month.  [facility] information given.  Pap normal 2018, new screening guidelines reviewed.   [place], 5:00 PM 01/01/2018

## 2018-01-01 NOTE — Patient Instructions (Signed)

## 2018-08-28 DIAGNOSIS — K581 Irritable bowel syndrome with constipation: Secondary | ICD-10-CM | POA: Diagnosis not present

## 2018-09-24 DIAGNOSIS — N3941 Urge incontinence: Secondary | ICD-10-CM | POA: Diagnosis not present

## 2018-09-24 DIAGNOSIS — R3989 Other symptoms and signs involving the genitourinary system: Secondary | ICD-10-CM | POA: Diagnosis not present

## 2018-10-19 DIAGNOSIS — D3131 Benign neoplasm of right choroid: Secondary | ICD-10-CM | POA: Diagnosis not present

## 2018-12-10 ENCOUNTER — Telehealth: Payer: Self-pay | Admitting: *Deleted

## 2018-12-10 DIAGNOSIS — Z3041 Encounter for surveillance of contraceptive pills: Secondary | ICD-10-CM

## 2018-12-10 MED ORDER — NORETHIN ACE-ETH ESTRAD-FE 1-20 MG-MCG PO TABS: ORAL_TABLET | ORAL | 0 refills | Status: DC

## 2018-12-10 NOTE — Telephone Encounter (Signed)
Patient has annual exam scheduled on 01/20/18, needs refill on birth control pills. Rx sent.

## 2018-12-29 DIAGNOSIS — M79672 Pain in left foot: Secondary | ICD-10-CM | POA: Diagnosis not present

## 2019-01-04 ENCOUNTER — Ambulatory Visit (INDEPENDENT_AMBULATORY_CARE_PROVIDER_SITE_OTHER): Payer: BC Managed Care – PPO | Admitting: Podiatry

## 2019-01-04 ENCOUNTER — Other Ambulatory Visit: Payer: Self-pay

## 2019-01-04 ENCOUNTER — Ambulatory Visit: Payer: Self-pay

## 2019-01-04 ENCOUNTER — Encounter: Payer: Self-pay | Admitting: Podiatry

## 2019-01-04 DIAGNOSIS — M84375A Stress fracture, left foot, initial encounter for fracture: Secondary | ICD-10-CM

## 2019-01-04 DIAGNOSIS — M722 Plantar fascial fibromatosis: Secondary | ICD-10-CM

## 2019-01-04 MED ORDER — MELOXICAM 15 MG PO TABS: 15.0000 mg | ORAL_TABLET | Freq: Every day | ORAL | 1 refills | Status: DC

## 2019-01-08 NOTE — Progress Notes (Signed)
   Subjective: 48 y.o. female presenting today as a new patient with a chief complaint of constant aching pain noted to the left heel that began 2-3 months ago. She states the pain radiates to the dorsal foot as well. She states the pain is worse when she first stands after being seated for a while. Walking increases the pain. She has been taking Ibuprofen, elevating the foot and using insoles for treatment. Patient is here for further evaluation and treatment.   Past Medical History:  Diagnosis Date  . Anxiety   . CIN II (cervical intraepithelial neoplasia II) 1998   LEEP  . Depression   . Endometriosis   . Hyperthyroidism    DR. BALAN  . Migraines      Objective: Physical Exam General: The patient is alert and oriented x3 in no acute distress.  Dermatology: Skin is warm, dry and supple bilateral lower extremities. Negative for open lesions or macerations bilateral.   Vascular: Dorsalis Pedis and Posterior Tibial pulses palpable bilateral.  Capillary fill time is immediate to all digits.  Neurological: Epicritic and protective threshold intact bilateral.   Musculoskeletal: Tenderness to palpation to the plantar aspect of the left heel along the plantar fascia as well as to the fourth metatarsal. All other joints range of motion within normal limits bilateral. Strength 5/5 in all groups bilateral.   Assessment: 1. Plantar fasciitis left foot 2. Stress fracture 4th metatarsal left based on clinical exam   Plan of Care:  1. Patient evaluated.  2. Injection of 0.5cc Celestone soluspan injected into the left plantar fascia.  3. CAM boot dispensed.  4. Rx for Meloxicam ordered for patient. 5. Return to clinic in 4 weeks.    Goes by [employer]. Does [employer] classes at 6 am.    Edrick Kins, DPM [facility]  Dr. Edrick Kins, DPM    [address], [place] [address]                Office [phone]  Fax [phone]

## 2019-01-09 ENCOUNTER — Ambulatory Visit: Payer: BLUE CROSS/BLUE SHIELD | Admitting: Podiatry

## 2019-01-17 DIAGNOSIS — U071 COVID-19: Secondary | ICD-10-CM | POA: Diagnosis not present

## 2019-01-21 ENCOUNTER — Encounter: Payer: BLUE CROSS/BLUE SHIELD | Admitting: Women's Health

## 2019-01-22 DIAGNOSIS — U071 COVID-19: Secondary | ICD-10-CM | POA: Diagnosis not present

## 2019-01-22 DIAGNOSIS — Z9189 Other specified personal risk factors, not elsewhere classified: Secondary | ICD-10-CM | POA: Diagnosis not present

## 2019-02-04 ENCOUNTER — Ambulatory Visit (INDEPENDENT_AMBULATORY_CARE_PROVIDER_SITE_OTHER): Payer: BLUE CROSS/BLUE SHIELD | Admitting: Podiatry

## 2019-02-04 ENCOUNTER — Other Ambulatory Visit: Payer: Self-pay

## 2019-02-04 DIAGNOSIS — M84375A Stress fracture, left foot, initial encounter for fracture: Secondary | ICD-10-CM | POA: Diagnosis not present

## 2019-02-04 DIAGNOSIS — M722 Plantar fascial fibromatosis: Secondary | ICD-10-CM

## 2019-02-07 NOTE — Progress Notes (Signed)
   Subjective: 49 y.o. female presenting today for follow up evaluation of left foot pain secondary to plantar fasciitis and a stress fracture of the fourth metatarsal. She states she wears the CAM boot daily. She reports improvement in the pain but states it is still present in the heel. She has been taking Meloxicam as directed. Patient is here for further evaluation and treatment.   Past Medical History:  Diagnosis Date  . Anxiety   . CIN II (cervical intraepithelial neoplasia II) 1998   LEEP  . Depression   . Endometriosis   . Hyperthyroidism    DR. BALAN  . Migraines      Objective: Physical Exam General: The patient is alert and oriented x3 in no acute distress.  Dermatology: Skin is warm, dry and supple bilateral lower extremities. Negative for open lesions or macerations bilateral.   Vascular: Dorsalis Pedis and Posterior Tibial pulses palpable bilateral.  Capillary fill time is immediate to all digits.  Neurological: Epicritic and protective threshold intact bilateral.   Musculoskeletal: Tenderness to palpation to the plantar aspect of the left heel along the plantar fascia as well as to the fourth metatarsal. All other joints range of motion within normal limits bilateral. Strength 5/5 in all groups bilateral.   Assessment: 1. Plantar fasciitis left foot - improved  2. Stress fracture 4th metatarsal left based on clinical exam - improved   Plan of Care:  1. Patient evaluated.  2. Discontinue using CAM boot.  3. OTC insoles provided to patient.  4. Slowly return to activity.  5. Continue taking Meloxicam as needed.  6. Return to clinic as needed.    Goes by [employer]. Does [employer] classes at 6 am.    Edrick Kins, DPM [facility]  Dr. Edrick Kins, DPM    [address], [place] [address]                Office [phone]  Fax [phone]

## 2019-02-11 ENCOUNTER — Other Ambulatory Visit: Payer: Self-pay

## 2019-02-12 ENCOUNTER — Telehealth: Payer: Self-pay

## 2019-02-12 ENCOUNTER — Ambulatory Visit (INDEPENDENT_AMBULATORY_CARE_PROVIDER_SITE_OTHER): Payer: BLUE CROSS/BLUE SHIELD | Admitting: Women's Health

## 2019-02-12 ENCOUNTER — Encounter: Payer: Self-pay | Admitting: Women's Health

## 2019-02-12 VITALS — BP 125/83 | Ht 62.0 in | Wt 140.2 lb

## 2019-02-12 DIAGNOSIS — F3289 Other specified depressive episodes: Secondary | ICD-10-CM

## 2019-02-12 DIAGNOSIS — Z01419 Encounter for gynecological examination (general) (routine) without abnormal findings: Secondary | ICD-10-CM | POA: Diagnosis not present

## 2019-02-12 DIAGNOSIS — R519 Headache, unspecified: Secondary | ICD-10-CM

## 2019-02-12 DIAGNOSIS — F410 Panic disorder [episodic paroxysmal anxiety] without agoraphobia: Secondary | ICD-10-CM | POA: Diagnosis not present

## 2019-02-12 MED ORDER — ALPRAZOLAM 0.25 MG PO TABS: 0.2500 mg | ORAL_TABLET | Freq: Every evening | ORAL | 1 refills | Status: DC | PRN

## 2019-02-12 MED ORDER — PROMETHAZINE HCL 25 MG PO TABS: 25.0000 mg | ORAL_TABLET | Freq: Four times a day (QID) | ORAL | 0 refills | Status: DC | PRN

## 2019-02-12 MED ORDER — TRULANCE 3 MG PO TABS: 3.0000 mg | ORAL_TABLET | Freq: Every morning | ORAL | 1 refills | Status: DC

## 2019-02-12 MED ORDER — VENLAFAXINE HCL ER 150 MG PO CP24: ORAL_CAPSULE | ORAL | 4 refills | Status: DC

## 2019-02-12 MED ORDER — SUMATRIPTAN SUCCINATE 50 MG PO TABS: 50.0000 mg | ORAL_TABLET | ORAL | 6 refills | Status: DC | PRN

## 2019-02-12 MED ORDER — NORETHINDRONE ACET-ETHINYL EST 1-20 MG-MCG PO TABS: ORAL_TABLET | ORAL | 11 refills | Status: DC

## 2019-02-12 MED ORDER — NORETHIN-ETH ESTRAD-FE BIPHAS 1 MG-10 MCG / 10 MCG PO TABS: ORAL_TABLET | ORAL | 4 refills | Status: DC

## 2019-02-12 NOTE — Telephone Encounter (Signed)
Loestrin 1/20 FE take continuously for packs with 4 refills

## 2019-02-12 NOTE — Telephone Encounter (Signed)
Pharmacy sent note regarding Lo Loestrin FE. " Very expensive for patient...wants [place] to request alternate birth control."

## 2019-02-12 NOTE — Progress Notes (Signed)
Carrie Jensen [birth year] [id number]    History:    Presents for annual exam.  Amenorrheic on Loestrin continuously without complaint.  1998 CIN-2 LEEP with normal Paps after.  Normal mammogram history.  Anxiety depression stable on Effexor rare use of Xanax.  Basal cell skin cancer annual skin checks.  Migraines less frequent.  History of hyperthyroidism endocrinologist manages.  Plantar fasciitis orthopedic managing.  Had Covid in December.  Past medical history, past surgical history, family history and social history were all reviewed and documented in the EPIC chart.  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  ROS:  A ROS was performed and pertinent positives and negatives are included.  Exam:  Vitals:   02/12/19 0842  BP: 125/83  Weight: 140 lb 3.2 oz (63.6 kg)  Height: 5\' 2"  (1.575 m)   Body mass index is 25.64 kg/m.   General appearance:  Normal Thyroid:  Symmetrical, normal in size, without palpable masses or nodularity. Respiratory  Auscultation:  Clear without wheezing or rhonchi Cardiovascular  Auscultation:  Regular rate, without rubs, murmurs or gallops  Edema/varicosities:  Not grossly evident Abdominal  Soft,nontender, without masses, guarding or rebound.  Liver/spleen:  No organomegaly noted  Hernia:  None appreciated  Skin  Inspection:  Grossly normal   Breasts: Examined lying and sitting.     Right: Without masses, retractions, discharge or axillary adenopathy.     Left: Without masses, retractions, discharge or axillary adenopathy. Gentitourinary   Inguinal/mons:  Normal without inguinal adenopathy  External genitalia:  Normal  BUS/Urethra/Skene's glands:  Normal  Vagina:  Normal  Cervix:  Normal  Uterus:   normal in size, shape and contour.  Midline and mobile  Adnexa/parametria:     Rt: Without masses or tenderness.   Lt: Without masses or tenderness.  Anus and perineum: Normal  Digital rectal exam: Normal sphincter tone without palpated masses or  tenderness  Assessment/Plan:  49 y.o. MWF G0 for annual exam with no complaints.  Amenorrheic on Loestrin continuously 1998 LEEP for CIN-2 normal Paps after Anxiety/depression stable on Effexor Migraines decreasing in frequency IBS with constipation good relief with Trulance Labs-primary care  Plan: Loestrin 1/20 prescription, proper use, continue taking daily, having no menopausal symptoms at this time, will check an FSH placebo week next year.  Refill of Imitrex 50 mg aware of proper administration given.  Effexor ER 150 mg p.o. daily will continue refill given aware of need for self-care, leisure activities.  A 1 month supply of Trulance given instructed to follow-up with GI for refills.  Xanax 0.25 refill given uses 1 prescription annually aware of addictive properties.  SBEs, continue annual 3D screening mammograms, calcium rich foods, vitamin D 2000 IUs daily.  Screening colonoscopy discussed instructed to schedule [facility] information given..  Labs primary care.  Pap normal with negative HR HPV 12/2016, new screening guidelines reviewed.    [place], 8:43 AM 02/12/2019

## 2019-02-12 NOTE — Patient Instructions (Addendum)
It was good to see you today Vitamin D 2000 IUs daily Colonoscopy [facility] [phone], Dr. Carlean Purl  CURRENT CDC RECOMMENDATIONS FOR RECEIVING COVID VACCINE AFTER COVID IS 45 DAYS   Health Maintenance for Postmenopausal Women Menopause is a normal process in which your ability to get pregnant comes to an end. This process happens slowly over many months or years, usually between the ages of 71 and 68. Menopause is complete when you have missed your menstrual periods for 12 months. It is important to talk with your health care provider about some of the most common conditions that affect women after menopause (postmenopausal women). These include heart disease, cancer, and bone loss (osteoporosis). Adopting a healthy lifestyle and getting preventive care can help to promote your health and wellness. The actions you take can also lower your chances of developing some of these common conditions. What should I know about menopause? During menopause, you may get a number of symptoms, such as:  Hot flashes. These can be moderate or severe.  Night sweats.  Decrease in sex drive.  Mood swings.  Headaches.  Tiredness.  Irritability.  Memory problems.  Insomnia. Choosing to treat or not to treat these symptoms is a decision that you make with your health care provider. Do I need hormone replacement therapy?  Hormone replacement therapy is effective in treating symptoms that are caused by menopause, such as hot flashes and night sweats.  Hormone replacement carries certain risks, especially as you become older. If you are thinking about using estrogen or estrogen with progestin, discuss the benefits and risks with your health care provider. What is my risk for heart disease and stroke? The risk of heart disease, heart attack, and stroke increases as you age. One of the causes may be a change in the body's hormones during menopause. This can affect how your body uses dietary fats,  triglycerides, and cholesterol. Heart attack and stroke are medical emergencies. There are many things that you can do to help prevent heart disease and stroke. Watch your blood pressure  High blood pressure causes heart disease and increases the risk of stroke. This is more likely to develop in people who have high blood pressure readings, are of African descent, or are overweight.  Have your blood pressure checked: ? Every 3-5 years if you are 3-8 years of age. ? Every year if you are 34 years old or older. Eat a healthy diet   Eat a diet that includes plenty of vegetables, fruits, low-fat dairy products, and lean protein.  Do not eat a lot of foods that are high in solid fats, added sugars, or sodium. Get regular exercise Get regular exercise. This is one of the most important things you can do for your health. Most adults should:  Try to exercise for at least 150 minutes each week. The exercise should increase your heart rate and make you sweat (moderate-intensity exercise).  Try to do strengthening exercises at least twice each week. Do these in addition to the moderate-intensity exercise.  Spend less time sitting. Even light physical activity can be beneficial. Other tips  Work with your health care provider to achieve or maintain a healthy weight.  Do not use any products that contain nicotine or tobacco, such as cigarettes, e-cigarettes, and chewing tobacco. If you need help quitting, ask your health care provider.  Know your numbers. Ask your health care provider to check your cholesterol and your blood sugar (glucose). Continue to have your blood tested as directed  by your health care provider. Do I need screening for cancer? Depending on your health history and family history, you may need to have cancer screening at different stages of your life. This may include screening for:  Breast cancer.  Cervical cancer.  Lung cancer.  Colorectal cancer. What is my risk for  osteoporosis? After menopause, you may be at increased risk for osteoporosis. Osteoporosis is a condition in which bone destruction happens more quickly than new bone creation. To help prevent osteoporosis or the bone fractures that can happen because of osteoporosis, you may take the following actions:  If you are 63-11 years old, get at least 1,000 mg of calcium and at least 600 mg of vitamin D per day.  If you are older than age 26 but younger than age 101, get at least 1,200 mg of calcium and at least 600 mg of vitamin D per day.  If you are older than age 38, get at least 1,200 mg of calcium and at least 800 mg of vitamin D per day. Smoking and drinking excessive alcohol increase the risk of osteoporosis. Eat foods that are rich in calcium and vitamin D, and do weight-bearing exercises several times each week as directed by your health care provider. How does menopause affect my mental health? Depression may occur at any age, but it is more common as you become older. Common symptoms of depression include:  Low or sad mood.  Changes in sleep patterns.  Changes in appetite or eating patterns.  Feeling an overall lack of motivation or enjoyment of activities that you previously enjoyed.  Frequent crying spells. Talk with your health care provider if you think that you are experiencing depression. General instructions See your health care provider for regular wellness exams and vaccines. This may include:  Scheduling regular health, dental, and eye exams.  Getting and maintaining your vaccines. These include: ? Influenza vaccine. Get this vaccine each year before the flu season begins. ? Pneumonia vaccine. ? Shingles vaccine. ? Tetanus, diphtheria, and pertussis (Tdap) booster vaccine. Your health care provider may also recommend other immunizations. Tell your health care provider if you have ever been abused or do not feel safe at home. Summary  Menopause is a normal process in  which your ability to get pregnant comes to an end.  This condition causes hot flashes, night sweats, decreased interest in sex, mood swings, headaches, or lack of sleep.  Treatment for this condition may include hormone replacement therapy.  Take actions to keep yourself healthy, including exercising regularly, eating a healthy diet, watching your weight, and checking your blood pressure and blood sugar levels.  Get screened for cancer and depression. Make sure that you are up to date with all your vaccines. This information is not intended to replace advice given to you by your health care provider. Make sure you discuss any questions you have with your health care provider. Document Revised: 12/27/2017 Document Reviewed: 12/27/2017 Elsevier Patient Education  2020 [employer].

## 2019-02-14 LAB — PAP IG W/ RFLX HPV ASCU

## 2019-02-14 LAB — HUMAN PAPILLOMAVIRUS, HIGH RISK: HPV DNA High Risk: NOT DETECTED

## 2019-04-01 DIAGNOSIS — H1132 Conjunctival hemorrhage, left eye: Secondary | ICD-10-CM | POA: Diagnosis not present

## 2019-04-01 DIAGNOSIS — H0102B Squamous blepharitis left eye, upper and lower eyelids: Secondary | ICD-10-CM | POA: Diagnosis not present

## 2019-04-01 DIAGNOSIS — H0102A Squamous blepharitis right eye, upper and lower eyelids: Secondary | ICD-10-CM | POA: Diagnosis not present

## 2019-04-01 DIAGNOSIS — H04123 Dry eye syndrome of bilateral lacrimal glands: Secondary | ICD-10-CM | POA: Diagnosis not present

## 2019-06-17 ENCOUNTER — Ambulatory Visit
Admission: EM | Admit: 2019-06-17 | Discharge: 2019-06-17 | Disposition: A | Discharge status: Home / Self Care | Payer: BLUE CROSS/BLUE SHIELD | Attending: Family Medicine | Admitting: Family Medicine

## 2019-06-17 ENCOUNTER — Encounter: Payer: Self-pay | Admitting: *Deleted

## 2019-06-17 ENCOUNTER — Other Ambulatory Visit: Payer: Self-pay

## 2019-06-17 DIAGNOSIS — B9689 Other specified bacterial agents as the cause of diseases classified elsewhere: Secondary | ICD-10-CM

## 2019-06-17 DIAGNOSIS — L089 Local infection of the skin and subcutaneous tissue, unspecified: Secondary | ICD-10-CM | POA: Diagnosis not present

## 2019-06-17 DIAGNOSIS — W57XXXA Bitten or stung by nonvenomous insect and other nonvenomous arthropods, initial encounter: Secondary | ICD-10-CM

## 2019-06-17 MED ORDER — TRIAMCINOLONE ACETONIDE 0.1 % EX CREA: 1.0000 "application " | TOPICAL_CREAM | Freq: Three times a day (TID) | CUTANEOUS | 0 refills | Status: DC

## 2019-06-17 MED ORDER — DOXYCYCLINE HYCLATE 100 MG PO CAPS: 100.0000 mg | ORAL_CAPSULE | Freq: Two times a day (BID) | ORAL | 0 refills | Status: DC

## 2019-06-17 NOTE — ED Provider Notes (Signed)
[name]    CSN: [id number] Arrival date & time: 06/17/19  1554      History   Chief Complaint Chief Complaint  Patient presents with  . Tick bite    HPI Carrie VEKSLER is a 49 y.o. female.   HPI  Patient seen today for evaluation of tick bite x 6 days ago involving the right breast. Patient was able to completely remove the tick with tweezers. The tick was removed intact. Since that time she has experienced severe itching and expanding redness from the initial site of the tick bite. There is mild tenderness at the site, mostly generalized itching now involving the entire breast.No fever chills, nausea, vomiting.   Past Medical History:  Diagnosis Date  . Anxiety   . CIN II (cervical intraepithelial neoplasia II) 1998   LEEP  . Depression   . Endometriosis   . Hyperthyroidism    DR. BALAN  . IBS (irritable bowel syndrome)   . Migraines   . Plantar fasciitis of left foot     Patient Active Problem List   Diagnosis Date Noted  . 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 Anxiety   . Endometriosis   . Hyperthyroidism     Past Surgical History:  Procedure Laterality Date  . CERVICAL BIOPSY  W/ LOOP ELECTRODE EXCISION  1998   CIN II  . OVARIAN CYSTECTOMY, PARTIAL LSO, FULGURATION OF ENDOMETRIOSIS      OB History    Gravida  0   Para  0   Term      Preterm      AB      Living        SAB      TAB      Ectopic      Multiple      Live Births               Home Medications    Prior to Admission medications   Medication Sig Start Date End Date Taking? Authorizing Provider  ALPRAZolam (XANAX) 0.25 MG tablet Take 1 tablet (0.25 mg total) by mouth at bedtime as needed for anxiety. 02/12/19  Yes [name], NP  Ascorbic Acid (VITAMIN C) POWD Take by mouth.   Yes [provider]  Calcium Polycarbophil 625 MG CHEW Chew by mouth.   Yes [provider]  Cholecalciferol (VITAMIN D3) 25 MCG (1000 UT) CAPS Take  by mouth 2 (two) times daily.   Yes [provider]  meloxicam (MOBIC) 15 MG tablet Take 1 tablet (15 mg total) by mouth daily. 01/04/19  Yes Edrick Kins, DPM  Multiple Vitamin (MULTIVITAMIN) capsule Take 1 capsule by mouth daily.   Yes [provider]  norethindrone-ethinyl estradiol (LOESTRIN) 1-20 MG-MCG tablet Take one active pill daily continuously. Do not take placebos 02/12/19  Yes Young, Candiss Norse, NP  Plecanatide (TRULANCE) 3 MG TABS Take 3 mg by mouth every morning. 02/12/19  Yes [name], NP  SUMAtriptan (IMITREX) 50 MG tablet Take 1 tablet (50 mg total) by mouth every 2 (two) hours as needed for migraine. May repeat in 2 hours if headache persists or recurs. 02/12/19  Yes [name], NP  venlafaxine XR (EFFEXOR-XR) 150 MG 24 hr capsule TAKE ONE CAPSULE BY MOUTH ONCE DAILY WITH BREAKFAST 02/12/19  Yes [name], NP  Bisacodyl (LAXATIVE PO) Take by mouth daily as needed.    [provider]  doxycycline (VIBRAMYCIN) 100 MG  capsule Take 1 capsule (100 mg total) by mouth 2 (two) times daily. 06/17/19   Scot Jun, FNP  omeprazole (PRILOSEC) 20 MG capsule Take by mouth.    [provider]  promethazine (PHENERGAN) 25 MG tablet Take 1 tablet (25 mg total) by mouth every 6 (six) hours as needed for nausea. 02/12/19   [name], NP  triamcinolone cream (KENALOG) 0.1 % Apply 1 application topically 3 (three) times daily. 06/17/19   Scot Jun, FNP    Family History Family History  Problem Relation Age of Onset  . Cancer Maternal Aunt        OVARIAN  . Heart disease Maternal Grandmother   . Stroke Maternal Grandmother   . Diabetes Maternal Grandfather   . Hypertension Maternal Grandfather   . Heart disease Maternal Grandfather   . Heart disease Paternal Grandmother   . Heart disease Paternal Grandfather   . Breast cancer Neg Hx     Social History Social History   Tobacco Use  . Smoking status: Never Smoker  .  Smokeless tobacco: Never Used  Substance Use Topics  . Alcohol use: No  . Drug use: No     Allergies   Erythromycin  Review of Systems Review of Systems Pertinent negatives listed in HPI Physical Exam Triage Vital Signs ED Triage Vitals  Enc Vitals Group     BP 06/17/19 1649 117/69     Pulse Rate 06/17/19 1649 71     Resp 06/17/19 1649 16     Temp 06/17/19 1649 98.5 F (36.9 C)     Temp Source 06/17/19 1649 Oral     SpO2 06/17/19 1649 97 %     Weight --      Height --      Head Circumference --      Peak Flow --      Pain Score 06/17/19 1650 0     Pain Loc --      Pain Edu? --      Excl. in [place]? --   Updated Vital Signs BP 117/69   Pulse 71   Temp 98.5 F (36.9 C) (Oral)   Resp 16   SpO2 97%   Visual Acuity Right Eye Distance:   Left Eye Distance:   Bilateral Distance:    Right Eye Near:   Left Eye Near:    Bilateral Near:     Physical Exam Constitutional:      Appearance: Normal appearance.  Cardiovascular:     Rate and Rhythm: Normal rate.  Pulmonary:     Effort: Pulmonary effort is normal.     Breath sounds: Normal breath sounds.  Chest:    Neurological:     Mental Status: She is alert.      UC Treatments / Results  Labs (all labs ordered are listed, but only abnormal results are displayed) Labs Reviewed - No data to display  EKG   Radiology No results found.  Procedures Procedures (including critical care time)  Medications Ordered in UC Medications - No data to display  Initial Impression / Assessment and Plan / UC Course  I have reviewed the triage vital signs and the nursing notes.  Pertinent labs & imaging results that were available during my care of the patient were reviewed by me and considered in my medical decision making (see chart for details).     Skin infection secondary Tick Bite -Start Doxycyline 100 mg twice daily x 10 days concern for possible cellulitis.  -  Triamcinolone application 3 times daily until rash  resolves. -If symptoms worsen or infection doesn't resolve, return for follow-up   Final Clinical Impressions(s) / UC Diagnoses   Final diagnoses:  Superficial bacterial skin infection  Tick bite, initial encounter   Discharge Instructions   None    ED Prescriptions    Medication Sig Dispense Auth. Provider   doxycycline (VIBRAMYCIN) 100 MG capsule Take 1 capsule (100 mg total) by mouth 2 (two) times daily. 20 capsule Scot Jun, FNP   triamcinolone cream (KENALOG) 0.1 % Apply 1 application topically 3 (three) times daily. 90 g Scot Jun, FNP     PDMP not reviewed this encounter.   Scot Jun, FNP 06/17/19 1752

## 2019-06-17 NOTE — ED Triage Notes (Addendum)
Reports removing tick from right breast area 6 days ago.  Started with redness and pruritis 2 days ago to site. C/O some generalized body aches.  Denies fever.

## 2019-10-28 ENCOUNTER — Other Ambulatory Visit: Payer: Self-pay

## 2019-10-28 MED ORDER — NORETHINDRONE ACET-ETHINYL EST 1-20 MG-MCG PO TABS: ORAL_TABLET | ORAL | 3 refills | Status: DC

## 2020-01-24 ENCOUNTER — Other Ambulatory Visit: Payer: Self-pay | Admitting: Obstetrics & Gynecology

## 2020-02-28 ENCOUNTER — Telehealth: Payer: Self-pay | Admitting: *Deleted

## 2020-02-28 MED ORDER — NORETHINDRONE ACET-ETHINYL EST 1-20 MG-MCG PO TABS: ORAL_TABLET | ORAL | 0 refills | Status: DC

## 2020-02-28 NOTE — Telephone Encounter (Signed)
Patient has annual exam scheduled on 03/04/20, needs refill on Junel 1/20 mcg. Rx sent.

## 2020-03-04 ENCOUNTER — Ambulatory Visit: Payer: BC Managed Care – PPO | Admitting: Nurse Practitioner

## 2020-03-04 ENCOUNTER — Other Ambulatory Visit: Payer: Self-pay

## 2020-03-04 ENCOUNTER — Encounter: Payer: Self-pay | Admitting: Nurse Practitioner

## 2020-03-04 VITALS — BP 126/80 | Ht 62.0 in | Wt 150.0 lb

## 2020-03-04 DIAGNOSIS — Z01419 Encounter for gynecological examination (general) (routine) without abnormal findings: Secondary | ICD-10-CM

## 2020-03-04 DIAGNOSIS — F418 Other specified anxiety disorders: Secondary | ICD-10-CM | POA: Diagnosis not present

## 2020-03-04 DIAGNOSIS — K581 Irritable bowel syndrome with constipation: Secondary | ICD-10-CM | POA: Diagnosis not present

## 2020-03-04 DIAGNOSIS — Z3041 Encounter for surveillance of contraceptive pills: Secondary | ICD-10-CM | POA: Diagnosis not present

## 2020-03-04 MED ORDER — VENLAFAXINE HCL ER 150 MG PO CP24: ORAL_CAPSULE | ORAL | 4 refills | Status: DC

## 2020-03-04 MED ORDER — NORETHINDRONE ACET-ETHINYL EST 1-20 MG-MCG PO TABS: ORAL_TABLET | ORAL | 3 refills | Status: DC

## 2020-03-04 MED ORDER — TRULANCE 3 MG PO TABS: 3.0000 mg | ORAL_TABLET | Freq: Every morning | ORAL | 1 refills | Status: DC

## 2020-03-04 NOTE — Progress Notes (Signed)
   Carrie Jensen 1970/10/14 [id number]   History:  50 y.o. G0 presents for annual exam. OCPs continuously. 1998 CIN-2 LEEP, subsequent paps normal.  History of ovarian cystectomy, basal cell carcinoma, anxiety/depression, migraines, IBS, and hyperthyroidism.   Gynecologic History No LMP recorded (lmp unknown). (Menstrual status: Oral contraceptives).   Contraception/Family planning: OCP (estrogen/progesterone)  Health Maintenance Last Pap: 02/12/2019. Results were: ASCUS negative HPV Last mammogram: 04/2017. Results were: normal Last colonoscopy: Never Last Dexa: Never  Past medical history, past surgical history, family history and social history were all reviewed and documented in the EPIC chart.  ROS:  A ROS was performed and pertinent positives and negatives are included.  Exam:  Vitals:   03/04/20 1546  BP: 126/80  Weight: 150 lb (68 kg)  Height: 5\' 2"  (1.575 m)   Body mass index is 27.44 kg/m.  General appearance:  Normal Thyroid:  Symmetrical, normal in size, without palpable masses or nodularity. Respiratory  Auscultation:  Clear without wheezing or rhonchi Cardiovascular  Auscultation:  Regular rate, without rubs, murmurs or gallops  Edema/varicosities:  Not grossly evident Abdominal  Soft,nontender, without masses, guarding or rebound.  Liver/spleen:  No organomegaly noted  Hernia:  None appreciated  Skin  Inspection:  Grossly normal   Breasts: Examined lying and sitting.   Right: Without masses, retractions, discharge or axillary adenopathy.   Left: Without masses, retractions, discharge or axillary adenopathy. Gentitourinary   Inguinal/mons:  Normal without inguinal adenopathy  External genitalia:  Normal  BUS/Urethra/Skene's glands:  Normal  Vagina:  Normal  Cervix:  Normal  Uterus:  Normal in size, shape and contour.  Midline and mobile  Adnexa/parametria:     Rt: Without masses or tenderness.   Lt: Without masses or tenderness.  Anus and  perineum: Normal  Digital rectal exam: Normal sphincter tone without palpated masses or tenderness  Assessment/Plan:  50 y.o. G0 for annual exam.   Well female exam with routine gynecological exam - Education provided on SBEs, importance of preventative screenings, current guidelines, high calcium diet, regular exercise, and multivitamin daily.   Encounter for surveillance of contraceptive pills - Plan: norethindrone-ethinyl estradiol (JUNEL 1/20) 1-20 MG-MCG tablet continuously. Taking as prescribed. Refill x 1 year provided. She is not having menopausal symptoms. Discussed stopping OCPs 1 week prior to next annual to check [facility].   Irritable bowel syndrome with constipation - Plan: Plecanatide (TRULANCE) 3 MG TABS. She has been taking these daily and doing well. Recommend establishing with GI for management.   Depression with anxiety - Plan: venlafaxine XR (EFFEXOR-XR) 150 MG 24 hr capsule daily. Doing well on this and would like to continue. Refill x 1 year provided.   Screening for cervical cancer - 1998 CIN-2 LEEP, subsequent paps normal. Will repeat at 5-year interval per guidelines.  Screening for breast cancer - Normal mammogram history but overdue.  Discussed current guidelines and importance of preventative screenings.  She plans to schedule this soon.  Normal breast exam today.  Screening for colon cancer -has not had screening colonoscopy.  Information provided on [facility] she plans to schedule this soon.  Follow-up in 1 year for annual.    Carrie Jensen [facility], 4:00 PM 03/04/2020

## 2020-03-04 NOTE — Patient Instructions (Addendum)
Schedule colonoscopy! [facility] [phone] [address] [place], [place] [address]   Health Maintenance, Female Adopting a healthy lifestyle and getting preventive care are important in promoting health and wellness. Ask your health care provider about:  The right schedule for you to have regular tests and exams.  Things you can do on your own to prevent diseases and keep yourself healthy. What should I know about diet, weight, and exercise? Eat a healthy diet  Eat a diet that includes plenty of vegetables, fruits, low-fat dairy products, and lean protein.  Do not eat a lot of foods that are high in solid fats, added sugars, or sodium.   Maintain a healthy weight Body mass index (BMI) is used to identify weight problems. It estimates body fat based on height and weight. Your health care provider can help determine your BMI and help you achieve or maintain a healthy weight. Get regular exercise Get regular exercise. This is one of the most important things you can do for your health. Most adults should:  Exercise for at least 150 minutes each week. The exercise should increase your heart rate and make you sweat (moderate-intensity exercise).  Do strengthening exercises at least twice a week. This is in addition to the moderate-intensity exercise.  Spend less time sitting. Even light physical activity can be beneficial. Watch cholesterol and blood lipids Have your blood tested for lipids and cholesterol at 52 years of age, then have this test every 5 years. Have your cholesterol levels checked more often if:  Your lipid or cholesterol levels are high.  You are older than 52 years of age.  You are at high risk for heart disease. What should I know about cancer screening? Depending on your health history and family history, you may need to have cancer screening at various ages. This may include screening for:  Breast cancer.  Cervical cancer.  Colorectal cancer.  Skin  cancer.  Lung cancer. What should I know about heart disease, diabetes, and high blood pressure? Blood pressure and heart disease  High blood pressure causes heart disease and increases the risk of stroke. This is more likely to develop in people who have high blood pressure readings, are of African descent, or are overweight.  Have your blood pressure checked: ? Every 3-5 years if you are 18-39 years of age. ? Every year if you are 40 years old or older. Diabetes Have regular diabetes screenings. This checks your fasting blood sugar level. Have the screening done:  Once every three years after age 40 if you are at a normal weight and have a low risk for diabetes.  More often and at a younger age if you are overweight or have a high risk for diabetes. What should I know about preventing infection? Hepatitis B If you have a higher risk for hepatitis B, you should be screened for this virus. Talk with your health care provider to find out if you are at risk for hepatitis B infection. Hepatitis C Testing is recommended for:  Everyone born from 1945 through 1965.  Anyone with known risk factors for hepatitis C. Sexually transmitted infections (STIs)  Get screened for STIs, including gonorrhea and chlamydia, if: ? You are sexually active and are younger than 52 years of age. ? You are older than 52 years of age and your health care provider tells you that you are at risk for this type of infection. ? Your sexual activity has changed since you were last screened,   and you are at increased risk for chlamydia or gonorrhea. Ask your health care provider if you are at risk.  Ask your health care provider about whether you are at high risk for HIV. Your health care provider may recommend a prescription medicine to help prevent HIV infection. If you choose to take medicine to prevent HIV, you should first get tested for HIV. You should then be tested every 3 months for as long as you are taking  the medicine. Pregnancy  If you are about to stop having your period (premenopausal) and you may become pregnant, seek counseling before you get pregnant.  Take 400 to 800 micrograms (mcg) of folic acid every day if you become pregnant.  Ask for birth control (contraception) if you want to prevent pregnancy. Osteoporosis and menopause Osteoporosis is a disease in which the bones lose minerals and strength with aging. This can result in bone fractures. If you are 65 years old or older, or if you are at risk for osteoporosis and fractures, ask your health care provider if you should:  Be screened for bone loss.  Take a calcium or vitamin D supplement to lower your risk of fractures.  Be given hormone replacement therapy (HRT) to treat symptoms of menopause. Follow these instructions at home: Lifestyle  Do not use any products that contain nicotine or tobacco, such as cigarettes, e-cigarettes, and chewing tobacco. If you need help quitting, ask your health care provider.  Do not use street drugs.  Do not share needles.  Ask your health care provider for help if you need support or information about quitting drugs. Alcohol use  Do not drink alcohol if: ? Your health care provider tells you not to drink. ? You are pregnant, may be pregnant, or are planning to become pregnant.  If you drink alcohol: ? Limit how much you use to 0-1 drink a day. ? Limit intake if you are breastfeeding.  Be aware of how much alcohol is in your drink. In the [place] one drink equals one 12 oz bottle of beer (355 mL), one 5 oz glass of wine (148 mL), or one 1 oz glass of hard liquor (44 mL). General instructions  Schedule regular health, dental, and eye exams.  Stay current with your vaccines.  Tell your health care provider if: ? You often feel depressed. ? You have ever been abused or do not feel safe at home. Summary  Adopting a healthy lifestyle and getting preventive care are important in  promoting health and wellness.  Follow your health care provider's instructions about healthy diet, exercising, and getting tested or screened for diseases.  Follow your health care provider's instructions on monitoring your cholesterol and blood pressure. This information is not intended to replace advice given to you by your health care provider. Make sure you discuss any questions you have with your health care provider. Document Revised: 12/27/2017 Document Reviewed: 12/27/2017 Elsevier Patient Education  2021 [employer]  

## 2020-03-17 ENCOUNTER — Other Ambulatory Visit: Payer: Self-pay | Admitting: Nurse Practitioner

## 2020-03-17 DIAGNOSIS — Z3041 Encounter for surveillance of contraceptive pills: Secondary | ICD-10-CM

## 2020-04-22 ENCOUNTER — Other Ambulatory Visit: Payer: Self-pay

## 2020-04-22 DIAGNOSIS — R519 Headache, unspecified: Secondary | ICD-10-CM

## 2020-04-22 DIAGNOSIS — F410 Panic disorder [episodic paroxysmal anxiety] without agoraphobia: Secondary | ICD-10-CM

## 2020-04-22 MED ORDER — ALPRAZOLAM 0.25 MG PO TABS
0.2500 mg | ORAL_TABLET | Freq: Every evening | ORAL | 0 refills | Status: DC | PRN
Start: 2020-04-22 — End: 2020-04-22

## 2020-04-22 MED ORDER — SUMATRIPTAN SUCCINATE 50 MG PO TABS: 50.0000 mg | ORAL_TABLET | ORAL | 6 refills | Status: DC | PRN

## 2020-04-22 NOTE — Telephone Encounter (Signed)
Yes, I will. Please add pharmacy to prescription.

## 2020-04-22 NOTE — Addendum Note (Signed)
Addended by: Ramond Craver on: 04/22/2020 03:29 PM   Modules accepted: Orders

## 2020-04-22 NOTE — Telephone Encounter (Signed)
Tiffany, Please re-sign the Alprazolam so it will go electronically. It was set on print by error. I will shred the printed one. Thank you.

## 2020-04-22 NOTE — Telephone Encounter (Addendum)
AEX 03/04/20 with TW Sumatriptan last prescribed by [facility] on1/26/2021 for  #10 with 6 refills on   Alprazolam last prescribed  By [facility] 02/12/2019 for #30 x 1 refill by [facility]

## 2020-04-23 MED ORDER — ALPRAZOLAM 0.25 MG PO TABS
0.2500 mg | ORAL_TABLET | Freq: Every evening | ORAL | 0 refills | Status: DC | PRN
Start: 2020-04-23 — End: 2020-10-26

## 2020-07-04 ENCOUNTER — Ambulatory Visit
Admission: EM | Admit: 2020-07-04 | Discharge: 2020-07-04 | Disposition: A | Discharge status: Home / Self Care | Payer: BC Managed Care – PPO | Attending: Physician Assistant | Admitting: Physician Assistant

## 2020-07-04 ENCOUNTER — Encounter: Payer: Self-pay | Admitting: *Deleted

## 2020-07-04 ENCOUNTER — Other Ambulatory Visit: Payer: Self-pay

## 2020-07-04 DIAGNOSIS — W57XXXA Bitten or stung by nonvenomous insect and other nonvenomous arthropods, initial encounter: Secondary | ICD-10-CM | POA: Diagnosis not present

## 2020-07-04 DIAGNOSIS — S70361A Insect bite (nonvenomous), right thigh, initial encounter: Secondary | ICD-10-CM

## 2020-07-04 DIAGNOSIS — T148XXA Other injury of unspecified body region, initial encounter: Secondary | ICD-10-CM | POA: Diagnosis not present

## 2020-07-04 MED ORDER — HYDROCORTISONE 1 % EX CREA: TOPICAL_CREAM | CUTANEOUS | 0 refills | Status: DC

## 2020-07-04 NOTE — ED Triage Notes (Addendum)
Pt reports having 3 insect bites she noticed over a week ago; states she believes it was fleas that bit her; c/o lesion to right upper inner thigh, right distal thigh, area between left 4th and 5th toes.  States concerned because lesions not healing despite using multiple topical medications and Epsom salt soaks.

## 2020-07-04 NOTE — ED Provider Notes (Signed)
[name]    CSN: [id number] Arrival date & time: 07/04/20  0805      History   Chief Complaint Chief Complaint  Patient presents with   Insect Bite    HPI Carrie Jensen is a 50 y.o. female.   Pt presents with suspected insect bites to right thigh and right foot that she noticed about one week ago.  Pt reports her dog recently missed flea and tick medication and has fleas, they sleep with her in bed.  She reports itching to the areas.  Denies swelling, redness, fever, chills.  She has applied neosporin and has soaked the foot in warm epsom salt baths with temporary improvement.     Past Medical History:  Diagnosis Date   Anxiety    CIN II (cervical intraepithelial neoplasia II) 1998   LEEP   Depression    Endometriosis    Hyperthyroidism    DR. BALAN   IBS (irritable bowel syndrome)    Migraines    Plantar fasciitis of left foot     Patient Active Problem List   Diagnosis Date Noted   Nevus of choroid of right eye 12/09/2016   Anxiety    Endometriosis    Hyperthyroidism     Past Surgical History:  Procedure Laterality Date   CERVICAL BIOPSY  W/ LOOP ELECTRODE EXCISION  1998   CIN II   OVARIAN CYSTECTOMY, PARTIAL LSO, FULGURATION OF ENDOMETRIOSIS      OB History     Gravida  0   Para  0   Term  0   Preterm  0   AB  0   Living  0      SAB  0   IAB  0   Ectopic  0   Multiple  0   Live Births  0            Home Medications    Prior to Admission medications   Medication Sig Start Date End Date Taking? Authorizing Provider  Ascorbic Acid (VITAMIN C) POWD Take by mouth.   Yes [provider]  Bisacodyl (LAXATIVE PO) Take by mouth daily as needed.   Yes [provider]  Calcium Polycarbophil 625 MG CHEW Chew by mouth.   Yes [provider]  Cholecalciferol (VITAMIN D3) 25 MCG (1000 UT) CAPS Take by mouth 2 (two) times daily.   Yes [provider]  Multiple Vitamin  (MULTIVITAMIN) capsule Take 1 capsule by mouth daily.   Yes [provider]  norethindrone-ethinyl estradiol (JUNEL 1/20) 1-20 MG-MCG tablet TAKE ONE TABLET BY MOUTH DAILY CONTINUOUSLY 03/18/20  Yes Marny Lowenstein A, NP  omeprazole (PRILOSEC) 20 MG capsule Take by mouth.   Yes [provider]  Plecanatide (TRULANCE) 3 MG TABS Take 3 mg by mouth every morning. 03/04/20  Yes Marny Lowenstein A, NP  venlafaxine XR (EFFEXOR-XR) 150 MG 24 hr capsule TAKE ONE CAPSULE BY MOUTH ONCE DAILY WITH BREAKFAST 03/04/20  Yes Marny Lowenstein A, NP  ALPRAZolam (XANAX) 0.25 MG tablet Take 1 tablet (0.25 mg total) by mouth at bedtime as needed for anxiety or sleep. 04/23/20   Tamela Gammon, NP  doxycycline (VIBRAMYCIN) 100 MG capsule Take 1 capsule (100 mg total) by mouth 2 (two) times daily. 06/17/19   Scot Jun, FNP  meloxicam (MOBIC) 15 MG tablet Take 1 tablet (15 mg total) by mouth daily. 01/04/19   Edrick Kins, DPM  promethazine (PHENERGAN) 25 MG tablet Take 1 tablet (25  mg total) by mouth every 6 (six) hours as needed for nausea. 02/12/19   [name], NP  SUMAtriptan (IMITREX) 50 MG tablet Take 1 tablet (50 mg total) by mouth every 2 (two) hours as needed for migraine. May repeat in 2 hours if headache persists or recurs. 04/22/20   Marny Lowenstein A, NP  triamcinolone cream (KENALOG) 0.1 % Apply 1 application topically 3 (three) times daily. 06/17/19   Scot Jun, FNP    Family History Family History  Problem Relation Age of Onset   Cancer Maternal Aunt        OVARIAN   Heart disease Maternal Grandmother    Stroke Maternal Grandmother    Diabetes Maternal Grandfather    Hypertension Maternal Grandfather    Heart disease Maternal Grandfather    Heart disease Paternal Grandmother    Heart disease Paternal Grandfather    Breast cancer Neg Hx     Social History Social History   Tobacco Use   Smoking status: Never   Smokeless tobacco: Never  Vaping Use    Vaping Use: Never used  Substance Use Topics   Alcohol use: No   Drug use: No     Allergies   Erythromycin   Review of Systems Review of Systems  Constitutional:  Negative for chills and fever.  HENT:  Negative for ear pain and sore throat.   Eyes:  Negative for pain and visual disturbance.  Respiratory:  Negative for cough and shortness of breath.   Cardiovascular:  Negative for chest pain and palpitations.  Gastrointestinal:  Negative for abdominal pain and vomiting.  Genitourinary:  Negative for dysuria and hematuria.  Musculoskeletal:  Negative for arthralgias and back pain.  Skin:  Positive for rash. Negative for color change.  Neurological:  Negative for seizures and syncope.  All other systems reviewed and are negative.   Physical Exam Triage Vital Signs ED Triage Vitals  Enc Vitals Group     BP 07/04/20 0819 128/62     Pulse Rate 07/04/20 0819 89     Resp 07/04/20 0819 16     Temp 07/04/20 0819 97.8 F (36.6 C)     Temp src --      SpO2 07/04/20 0819 98 %     Weight --      Height --      Head Circumference --      Peak Flow --      Pain Score 07/04/20 0820 5     Pain Loc --      Pain Edu? --      Excl. in [place]? --    No data found.  Updated Vital Signs BP 128/62   Pulse 89   Temp 97.8 F (36.6 C)   Resp 16   SpO2 98%   Visual Acuity Right Eye Distance:   Left Eye Distance:   Bilateral Distance:    Right Eye Near:   Left Eye Near:    Bilateral Near:     Physical Exam Vitals and nursing note reviewed.  Constitutional:      General: She is not in acute distress.    Appearance: She is well-developed.  HENT:     Head: Normocephalic and atraumatic.  Eyes:     Conjunctiva/sclera: Conjunctivae normal.  Cardiovascular:     Rate and Rhythm: Normal rate and regular rhythm.     Heart sounds: No murmur heard. Pulmonary:     Effort: Pulmonary effort is normal. No respiratory distress.  Breath sounds: Normal breath sounds.  Abdominal:      Palpations: Abdomen is soft.     Tenderness: There is no abdominal tenderness.  Musculoskeletal:     Cervical back: Neck supple.       Legs:       Feet:  Skin:    General: Skin is warm and dry.  Neurological:     Mental Status: She is alert.     UC Treatments / Results  Labs (all labs ordered are listed, but only abnormal results are displayed) Labs Reviewed - No data to display  EKG   Radiology No results found.  Procedures Procedures (including critical care time)  Medications Ordered in UC Medications - No data to display  Initial Impression / Assessment and Plan / UC Course  I have reviewed the triage vital signs and the nursing notes.  Pertinent labs & imaging results that were available during my care of the patient were reviewed by me and considered in my medical decision making (see chart for details).     Two well healing "bites" to right thigh, small blister noted to 5th toe.  Advised pt to apply steroid cream as needed for itching.  Keep blister clean and dry.  Return precautions discussed.  Final Clinical Impressions(s) / UC Diagnoses   Final diagnoses:  None   Discharge Instructions   None    ED Prescriptions   None    PDMP not reviewed this encounter.   Konrad Felix, PA-C 07/04/20 [phone]

## 2020-07-04 NOTE — Discharge Instructions (Addendum)
Apply cream as needed for itching Keep blister clean and dry, apply antibiotic ointment Return for follow up if swelling, redness worsen.

## 2020-10-25 ENCOUNTER — Other Ambulatory Visit: Payer: Self-pay | Admitting: Nurse Practitioner

## 2020-10-25 DIAGNOSIS — F410 Panic disorder [episodic paroxysmal anxiety] without agoraphobia: Secondary | ICD-10-CM

## 2021-02-23 DIAGNOSIS — Z20822 Contact with and (suspected) exposure to covid-19: Secondary | ICD-10-CM | POA: Diagnosis not present

## 2021-02-23 DIAGNOSIS — R059 Cough, unspecified: Secondary | ICD-10-CM | POA: Diagnosis not present

## 2021-02-23 DIAGNOSIS — U071 COVID-19: Secondary | ICD-10-CM | POA: Diagnosis not present

## 2021-03-08 ENCOUNTER — Other Ambulatory Visit: Payer: Self-pay

## 2021-03-08 ENCOUNTER — Ambulatory Visit (INDEPENDENT_AMBULATORY_CARE_PROVIDER_SITE_OTHER): Payer: BC Managed Care – PPO | Admitting: Nurse Practitioner

## 2021-03-08 ENCOUNTER — Encounter: Payer: Self-pay | Admitting: Nurse Practitioner

## 2021-03-08 VITALS — BP 122/74 | Ht 61.0 in | Wt 145.0 lb

## 2021-03-08 DIAGNOSIS — Z3041 Encounter for surveillance of contraceptive pills: Secondary | ICD-10-CM | POA: Diagnosis not present

## 2021-03-08 DIAGNOSIS — Z01419 Encounter for gynecological examination (general) (routine) without abnormal findings: Secondary | ICD-10-CM

## 2021-03-08 DIAGNOSIS — N912 Amenorrhea, unspecified: Secondary | ICD-10-CM

## 2021-03-08 DIAGNOSIS — F418 Other specified anxiety disorders: Secondary | ICD-10-CM | POA: Diagnosis not present

## 2021-03-08 DIAGNOSIS — K581 Irritable bowel syndrome with constipation: Secondary | ICD-10-CM

## 2021-03-08 DIAGNOSIS — R519 Headache, unspecified: Secondary | ICD-10-CM

## 2021-03-08 MED ORDER — VENLAFAXINE HCL ER 150 MG PO CP24: ORAL_CAPSULE | ORAL | 4 refills | Status: DC

## 2021-03-08 MED ORDER — SUMATRIPTAN SUCCINATE 50 MG PO TABS: 50.0000 mg | ORAL_TABLET | ORAL | 6 refills | Status: DC | PRN

## 2021-03-08 MED ORDER — PROMETHAZINE HCL 25 MG PO TABS: 25.0000 mg | ORAL_TABLET | Freq: Four times a day (QID) | ORAL | 0 refills | Status: DC | PRN

## 2021-03-08 MED ORDER — TRULANCE 3 MG PO TABS: 3.0000 mg | ORAL_TABLET | Freq: Every morning | ORAL | 1 refills | Status: DC

## 2021-03-08 NOTE — Progress Notes (Signed)
Carrie Jensen 06/04/1970 [id number]   History:  51 y.o. G0 presents for annual exam. OCPs continuously. Has occasional night sweats. 1998 CIN-2 LEEP, subsequent paps normal.  History of ovarian cystectomy, basal cell carcinoma, anxiety/depression, migraines, IBS, and hyperthyroidism.   Gynecologic History No LMP recorded. (Menstrual status: Oral contraceptives).   Contraception/Family planning: OCP (estrogen/progesterone) Sexually active: Yes  Health Maintenance Last Pap: 02/12/2019. Results were: ASCUS negative HPV, 3-year repeat Last mammogram: 04/26/2017. Results were: Normal Last colonoscopy: Never Last Dexa: Never  Past medical history, past surgical history, family history and social history were all reviewed and documented in the EPIC chart. 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   ROS:  A ROS was performed and pertinent positives and negatives are included.  Exam:  Vitals:   03/08/21 1602  BP: 122/74  Weight: 145 lb (65.8 kg)  Height: 5\' 1"  (1.549 m)    Body mass index is 27.4 kg/m.  General appearance:  Normal Thyroid:  Symmetrical, normal in size, without palpable masses or nodularity. Respiratory  Auscultation:  Clear without wheezing or rhonchi Cardiovascular  Auscultation:  Regular rate, without rubs, murmurs or gallops  Edema/varicosities:  Not grossly evident Abdominal  Soft,nontender, without masses, guarding or rebound.  Liver/spleen:  No organomegaly noted  Hernia:  None appreciated  Skin  Inspection:  Grossly normal   Breasts: Examined lying and sitting.   Right: Without masses, retractions, discharge or axillary adenopathy.   Left: Without masses, retractions, discharge or axillary adenopathy. Genitourinary   Inguinal/mons:  Normal without inguinal adenopathy  External genitalia:  Normal appearing vulva with no masses, tenderness, or lesions  BUS/Urethra/Skene's glands:  Normal  Vagina:  Normal appearing with normal color and  discharge, no lesions  Cervix:  Normal appearing without discharge or lesions  Uterus:  Normal in size, shape and contour.  Midline and mobile, nontender  Adnexa/parametria:     Rt: Normal in size, without masses or tenderness.   Lt: Normal in size, without masses or tenderness.  Anus and perineum: Normal  Digital rectal exam: Normal sphincter tone without palpated masses or tenderness  Patient informed chaperone available to be present for breast and pelvic exam. Patient has requested no chaperone to be present. Patient has been advised what will be completed during breast and pelvic exam.   Assessment/Plan:  51 y.o. G0 for annual exam.   Well female exam with routine gynecological exam - Education provided on SBEs, importance of preventative screenings, current guidelines, high calcium diet, regular exercise, and multivitamin daily. Labs with work this Thursday.   Encounter for surveillance of contraceptive pills - Plan: norethindrone-ethinyl estradiol (JUNEL 1/20) 1-20 MG-MCG tablet continuously. Taking as prescribed. Having some night sweats. She will stop OCPs for 2-3 weeks and return for estradiol and FSH check. If bleeding occurs after stopping she will restart and we will reevaluate next year.   Irritable bowel syndrome with constipation - Plan: Plecanatide (TRULANCE) 3 MG TABS. It was recommended she see GI for management. She has been having worsening IBS symptoms lately. Will provide refill in the interim.   Depression with anxiety - Plan: venlafaxine XR (EFFEXOR-XR) 150 MG 24 hr capsule daily. Doing well on this and would like to continue. Refill x 1 year provided.   Amenorrhea - Plan: Estradiol, Follicle stimulating hormone. Will return in 2-3 weeks after stopping OCPs to evaluate menopausal status.   Generalized headaches - Plan: SUMAtriptan (IMITREX) 50 MG tablet, promethazine (PHENERGAN) 25 MG tablet. Has been taking Imitrex less frequently.  Screening for cervical cancer -  1998 CIN-2 LEEP, subsequent paps normal. Will repeat at 3-year interval per guidelines.  Screening for breast cancer - Normal mammogram history but overdue.  Discussed current guidelines and importance of preventative screenings.  She plans to schedule this soon.  Normal breast exam today.  Screening for colon cancer - Has not had screening colonoscopy.  Information provided on [facility] she plans to schedule this soon. She has history of IBS and has seen GI in the past.   Follow-up in 1 year for annual.      Tamela Gammon [facility], 4:26 PM 03/08/2021

## 2021-03-10 ENCOUNTER — Encounter: Payer: Self-pay | Admitting: Internal Medicine

## 2021-03-10 ENCOUNTER — Other Ambulatory Visit: Payer: Self-pay | Admitting: Nurse Practitioner

## 2021-03-10 DIAGNOSIS — Z1231 Encounter for screening mammogram for malignant neoplasm of breast: Secondary | ICD-10-CM

## 2021-03-11 ENCOUNTER — Encounter: Payer: Self-pay | Admitting: Nurse Practitioner

## 2021-03-11 ENCOUNTER — Other Ambulatory Visit: Payer: Self-pay

## 2021-03-11 ENCOUNTER — Ambulatory Visit
Admission: RE | Admit: 2021-03-11 | Discharge: 2021-03-11 | Disposition: A | Discharge status: Home / Self Care | Payer: BC Managed Care – PPO | Source: Ambulatory Visit | Attending: Nurse Practitioner | Admitting: Nurse Practitioner

## 2021-03-11 DIAGNOSIS — Z1231 Encounter for screening mammogram for malignant neoplasm of breast: Secondary | ICD-10-CM

## 2021-03-15 ENCOUNTER — Other Ambulatory Visit: Payer: Self-pay | Admitting: Nurse Practitioner

## 2021-03-15 DIAGNOSIS — R928 Other abnormal and inconclusive findings on diagnostic imaging of breast: Secondary | ICD-10-CM

## 2021-04-02 ENCOUNTER — Other Ambulatory Visit: Payer: BC Managed Care – PPO

## 2021-04-02 ENCOUNTER — Other Ambulatory Visit: Payer: Self-pay

## 2021-04-02 DIAGNOSIS — N912 Amenorrhea, unspecified: Secondary | ICD-10-CM

## 2021-04-03 LAB — FOLLICLE STIMULATING HORMONE: FSH: 84.3 m[IU]/mL

## 2021-04-03 LAB — ESTRADIOL: Estradiol: <15 pg/mL

## 2021-04-06 ENCOUNTER — Encounter: Payer: Self-pay | Admitting: Nurse Practitioner

## 2021-04-08 ENCOUNTER — Other Ambulatory Visit: Payer: Self-pay

## 2021-04-08 ENCOUNTER — Ambulatory Visit
Admission: RE | Admit: 2021-04-08 | Discharge: 2021-04-08 | Disposition: A | Discharge status: Home / Self Care | Payer: BC Managed Care – PPO | Source: Ambulatory Visit | Attending: Nurse Practitioner | Admitting: Nurse Practitioner

## 2021-04-08 DIAGNOSIS — R928 Other abnormal and inconclusive findings on diagnostic imaging of breast: Secondary | ICD-10-CM

## 2021-04-08 DIAGNOSIS — R922 Inconclusive mammogram: Secondary | ICD-10-CM | POA: Diagnosis not present

## 2021-04-16 ENCOUNTER — Ambulatory Visit (AMBULATORY_SURGERY_CENTER): Payer: BC Managed Care – PPO | Admitting: *Deleted

## 2021-04-16 VITALS — Ht 62.0 in | Wt 155.0 lb

## 2021-04-16 DIAGNOSIS — Z1211 Encounter for screening for malignant neoplasm of colon: Secondary | ICD-10-CM

## 2021-04-16 MED ORDER — NA SULFATE-K SULFATE-MG SULF 17.5-3.13-1.6 GM/177ML PO SOLN: 2.0000 | Freq: Once | ORAL | 0 refills | Status: AC

## 2021-04-16 NOTE — Progress Notes (Signed)

## 2021-04-28 ENCOUNTER — Encounter: Payer: Self-pay | Admitting: Internal Medicine

## 2021-05-02 ENCOUNTER — Encounter: Payer: Self-pay | Admitting: Certified Registered Nurse Anesthetist

## 2021-05-07 ENCOUNTER — Ambulatory Visit (AMBULATORY_SURGERY_CENTER): Payer: BC Managed Care – PPO | Admitting: Internal Medicine

## 2021-05-07 ENCOUNTER — Encounter: Payer: Self-pay | Admitting: Internal Medicine

## 2021-05-07 VITALS — BP 118/58 | HR 70 | Temp 98.2°F | Resp 12 | Ht 61.0 in | Wt 155.0 lb

## 2021-05-07 DIAGNOSIS — Z1211 Encounter for screening for malignant neoplasm of colon: Secondary | ICD-10-CM

## 2021-05-07 MED ORDER — SODIUM CHLORIDE 0.9 % IV SOLN: 500.0000 mL | Freq: Once | INTRAVENOUS | Status: DC

## 2021-05-07 NOTE — Progress Notes (Signed)
Report given to PACU, vss 

## 2021-05-07 NOTE — Progress Notes (Signed)
Pt's states no medical or surgical changes since previsit or office visit. 

## 2021-05-07 NOTE — Progress Notes (Signed)
? ?GASTROENTEROLOGY PROCEDURE H&P NOTE  ? ?Primary Care Physician: ?Pcp, No ? ? ? ?Reason for Procedure:   Colon cancer screening ? ?Plan:    Colonoscopy ? ?Patient is appropriate for endoscopic procedure(s) in the ambulatory ([facility]) setting. ? ?The nature of the procedure, as well as the risks, benefits, and alternatives were carefully and thoroughly reviewed with the patient. Ample time for discussion and questions allowed. The patient understood, was satisfied, and agreed to proceed.  ? ? ? ?HPI: ?Carrie Jensen is a 51 y.o. female who presents for colonoscopy for colon cancer screening. Denies blood in stools, changes in bowel habits, weight loss. Denies fam hx of colon cancer. ? ?Past Medical History:  ?Diagnosis Date  ? Anxiety   ? Cancer [facility])   ? CIN II (cervical intraepithelial neoplasia II) 1998  ? LEEP  ? Depression   ? Endometriosis   ? GERD (gastroesophageal reflux disease)   ? Hyperthyroidism   ? DR. BALAN  ? IBS (irritable bowel syndrome)   ? Migraines   ? Plantar fasciitis of left foot   ? ? ?Past Surgical History:  ?Procedure Laterality Date  ? CERVICAL BIOPSY  W/ LOOP ELECTRODE EXCISION  01/18/1996  ? CIN II  ? MOHS SURGERY    ? OVARIAN CYSTECTOMY, PARTIAL LSO, FULGURATION OF ENDOMETRIOSIS    ? ? ?Prior to Admission medications   ?Medication Sig Start Date End Date Taking? Authorizing Provider  ?ALPRAZolam (XANAX) 0.25 MG tablet TAKE ONE TABLET BY MOUTH EVERY NIGHT AT BEDTIME AS NEEDED FOR ANXIETY OR SLEEP 10/26/20  Yes Tamela Gammon, NP  ?Ascorbic Acid (VITAMIN C) POWD Take by mouth.   Yes [provider]  ?Calcium Polycarbophil 625 MG CHEW Chew by mouth.   Yes [provider]  ?Cholecalciferol (VITAMIN D3) 25 MCG (1000 UT) CAPS Take by mouth 2 (two) times daily.   Yes [provider]  ?Melatonin 10 MG TABS Take by mouth.   Yes [provider]  ?omeprazole (PRILOSEC) 20 MG capsule Take by mouth.   Yes [provider]  ?Plecanatide (TRULANCE)  3 MG TABS Take 3 mg by mouth every morning. 03/08/21  Yes Tamela Gammon, NP  ?Probiotic Product (PROBIOTIC-10 PO) Take by mouth.   Yes [provider]  ?venlafaxine XR (EFFEXOR-XR) 150 MG 24 hr capsule TAKE ONE CAPSULE BY MOUTH ONCE DAILY WITH BREAKFAST 03/08/21  Yes Marny Lowenstein A, NP  ?doxycycline (VIBRAMYCIN) 100 MG capsule Take 1 capsule (100 mg total) by mouth 2 (two) times daily. ?Patient not taking: Reported on 03/08/2021 06/17/19   Scot Jun, FNP  ?hydrocortisone cream 1 % Apply to affected area 2 times daily ?Patient not taking: Reported on 03/08/2021 07/04/20   Ward, Lenise Arena, PA-C  ?meloxicam (MOBIC) 15 MG tablet Take 1 tablet (15 mg total) by mouth daily. ?Patient not taking: Reported on 03/08/2021 01/04/19   Edrick Kins, DPM  ?Multiple Vitamin (MULTIVITAMIN) capsule Take 1 capsule by mouth daily. ?Patient not taking: Reported on 03/08/2021    [provider]  ?norethindrone-ethinyl estradiol (JUNEL 1/20) 1-20 MG-MCG tablet TAKE ONE TABLET BY MOUTH DAILY CONTINUOUSLY ?Patient not taking: Reported on 04/16/2021 03/18/20   Tamela Gammon, NP  ?promethazine (PHENERGAN) 25 MG tablet Take 1 tablet (25 mg total) by mouth every 6 (six) hours as needed for nausea. 03/08/21   Tamela Gammon, NP  ?SUMAtriptan (IMITREX) 50 MG tablet Take 1 tablet (50 mg total) by mouth every 2 (two) hours as needed for  migraine. May repeat in 2 hours if headache persists or recurs. 03/08/21   Tamela Gammon, NP  ?triamcinolone cream (KENALOG) 0.1 % Apply 1 application topically 3 (three) times daily. 06/17/19   Scot Jun, FNP  ? ? ?Current Outpatient Medications  ?Medication Sig Dispense Refill  ? ALPRAZolam (XANAX) 0.25 MG tablet TAKE ONE TABLET BY MOUTH EVERY NIGHT AT BEDTIME AS NEEDED FOR ANXIETY OR SLEEP 30 tablet 0  ? Ascorbic Acid (VITAMIN C) POWD Take by mouth.    ? Calcium Polycarbophil 625 MG CHEW Chew by mouth.    ? Cholecalciferol (VITAMIN D3) 25 MCG (1000 UT) CAPS Take by  mouth 2 (two) times daily.    ? Melatonin 10 MG TABS Take by mouth.    ? omeprazole (PRILOSEC) 20 MG capsule Take by mouth.    ? Plecanatide (TRULANCE) 3 MG TABS Take 3 mg by mouth every morning. 30 tablet 1  ? Probiotic Product (PROBIOTIC-10 PO) Take by mouth.    ? venlafaxine XR (EFFEXOR-XR) 150 MG 24 hr capsule TAKE ONE CAPSULE BY MOUTH ONCE DAILY WITH BREAKFAST 90 capsule 4  ? doxycycline (VIBRAMYCIN) 100 MG capsule Take 1 capsule (100 mg total) by mouth 2 (two) times daily. (Patient not taking: Reported on 03/08/2021) 20 capsule 0  ? hydrocortisone cream 1 % Apply to affected area 2 times daily (Patient not taking: Reported on 03/08/2021) 15 g 0  ? meloxicam (MOBIC) 15 MG tablet Take 1 tablet (15 mg total) by mouth daily. (Patient not taking: Reported on 03/08/2021) 30 tablet 1  ? Multiple Vitamin (MULTIVITAMIN) capsule Take 1 capsule by mouth daily. (Patient not taking: Reported on 03/08/2021)    ? norethindrone-ethinyl estradiol (JUNEL 1/20) 1-20 MG-MCG tablet TAKE ONE TABLET BY MOUTH DAILY CONTINUOUSLY (Patient not taking: Reported on 04/16/2021) 21 tablet 3  ? promethazine (PHENERGAN) 25 MG tablet Take 1 tablet (25 mg total) by mouth every 6 (six) hours as needed for nausea. 30 tablet 0  ? SUMAtriptan (IMITREX) 50 MG tablet Take 1 tablet (50 mg total) by mouth every 2 (two) hours as needed for migraine. May repeat in 2 hours if headache persists or recurs. 10 tablet 6  ? triamcinolone cream (KENALOG) 0.1 % Apply 1 application topically 3 (three) times daily. 90 g 0  ? ?Current Facility-Administered Medications  ?Medication Dose Route Frequency Provider Last Rate Last Admin  ? 0.9 %  sodium chloride infusion  500 mL Intravenous Once Sharyn Creamer, MD      ? ? ?Allergies as of 05/07/2021 - Review Complete 05/07/2021  ?Allergen Reaction Noted  ? Erythromycin Nausea And Vomiting 09/02/2010  ? ? ?Family History  ?Problem Relation Age of Onset  ? Colon polyps Brother   ? Cancer Maternal Aunt   ?     OVARIAN  ? Heart  disease Maternal Grandmother   ? Stroke Maternal Grandmother   ? Diabetes Maternal Grandfather   ? Hypertension Maternal Grandfather   ? Heart disease Maternal Grandfather   ? Heart disease Paternal Grandmother   ? Heart disease Paternal Grandfather   ? Breast cancer Neg Hx   ? Colon cancer Neg Hx   ? Esophageal cancer Neg Hx   ? Stomach cancer Neg Hx   ? Rectal cancer Neg Hx   ? ? ?Social History  ? ?Socioeconomic History  ? Marital status: Married  ?  Spouse name: Not on file  ? Number of children: Not on file  ? Years of education: Not on file  ?  Highest education level: Not on file  ?Occupational History  ? Not on file  ?Tobacco Use  ? Smoking status: Never  ? Smokeless tobacco: Never  ?Vaping Use  ? Vaping Use: Never used  ?Substance and Sexual Activity  ? Alcohol use: No  ? Drug use: No  ? Sexual activity: Yes  ?  Partners: Male  ?  Birth control/protection: Pill  ?Other Topics Concern  ? Not on file  ?Social History Narrative  ? Not on file  ? ?Social Determinants of Health  ? ?Financial Resource Strain: Not on file  ?Food Insecurity: Not on file  ?Transportation Needs: Not on file  ?Physical Activity: Not on file  ?Stress: Not on file  ?Social Connections: Not on file  ?Intimate Partner Violence: Not on file  ? ? ?Physical Exam: ?Vital signs in last 24 hours: ?BP 122/72   Pulse 79   Temp 98.2 ?F (36.8 ?C)   Resp 16   Ht '5\' 1"'$  (1.549 m)   Wt 155 lb (70.3 kg)   SpO2 100%   BMI 29.29 kg/m?  ?GEN: NAD ?EYE: Sclerae anicteric ?ENT: MMM ?CV: Non-tachycardic ?Pulm: No increased work of breathing ?GI: Soft, NT/ND ?NEURO:  Alert & Oriented ? ? ?Christia Reading, MD ?[facility] ? ?05/07/2021 8:04 AM ? ?

## 2021-05-07 NOTE — Patient Instructions (Signed)
Handout on hemorrhoids given. ? ? ?YOU HAD AN ENDOSCOPIC PROCEDURE TODAY AT [facility]:   Refer to the procedure report that was given to you for any specific questions about what was found during the examination.  If the procedure report does not answer your questions, please call your gastroenterologist to clarify.  If you requested that your care partner not be given the details of your procedure findings, then the procedure report has been included in a sealed envelope for you to review at your convenience later. ? ?YOU SHOULD EXPECT: Some feelings of bloating in the abdomen. Passage of more gas than usual.  Walking can help get rid of the air that was put into your GI tract during the procedure and reduce the bloating. If you had a lower endoscopy (such as a colonoscopy or flexible sigmoidoscopy) you may notice spotting of blood in your stool or on the toilet paper. If you underwent a bowel prep for your procedure, you may not have a normal bowel movement for a few days. ? ?Please Note:  You might notice some irritation and congestion in your nose or some drainage.  This is from the oxygen used during your procedure.  There is no need for concern and it should clear up in a day or so. ? ?SYMPTOMS TO REPORT IMMEDIATELY: ? ?Following lower endoscopy (colonoscopy or flexible sigmoidoscopy): ? Excessive amounts of blood in the stool ? Significant tenderness or worsening of abdominal pains ? Swelling of the abdomen that is new, acute ? Fever of 100?F or higher ?For urgent or emergent issues, a gastroenterologist can be reached at any hour by calling [phone]. ?Do not use MyChart messaging for urgent concerns.  ? ? ?DIET:  We do recommend a small meal at first, but then you may proceed to your regular diet.  Drink plenty of fluids but you should avoid alcoholic beverages for 24 hours. ? ?ACTIVITY:  You should plan to take it easy for the rest of today and you should NOT DRIVE or use heavy  machinery until tomorrow (because of the sedation medicines used during the test).   ? ?FOLLOW UP: ?Our staff will call the number listed on your records 48-72 hours following your procedure to check on you and address any questions or concerns that you may have regarding the information given to you following your procedure. If we do not reach you, we will leave a message.  We will attempt to reach you two times.  During this call, we will ask if you have developed any symptoms of COVID 19. If you develop any symptoms (ie: fever, flu-like symptoms, shortness of breath, cough etc.) before then, please call [phone].  If you test positive for Covid 19 in the 2 weeks post procedure, please call and report this information to [place].   ? ?If any biopsies were taken you will be contacted by phone or by letter within the next 1-3 weeks.  Please call us at [phone] if you have not heard about the biopsies in 3 weeks.  ? ? ?SIGNATURES/CONFIDENTIALITY: ?You and/or your care partner have signed paperwork which will be entered into your electronic medical record.  These signatures attest to the fact that that the information above on your After Visit Summary has been reviewed and is understood.  Full responsibility of the confidentiality of this discharge information lies with you and/or your care-partner.  ?

## 2021-05-07 NOTE — Op Note (Signed)
[facility] ?Patient Name: Carrie Jensen ?Procedure Date: 05/07/2021 7:59 AM ?MRN: [id number] ?Endoscopist: Sonny Masters "Christia Reading ,  ?Age: 51 ?Referring MD:  ?Date of Birth: 1970-02-21 ?Gender: Female ?Account #: [id number] ?Procedure:                Colonoscopy ?Indications:              Screening for colorectal malignant neoplasm, This  ?                          is the patient's first colonoscopy ?Medicines:                Monitored Anesthesia Care ?Procedure:                Pre-Anesthesia Assessment: ?                          - Prior to the procedure, a History and Physical  ?                          was performed, and patient medications and  ?                          allergies were reviewed. The patient's tolerance of  ?                          previous anesthesia was also reviewed. The risks  ?                          and benefits of the procedure and the sedation  ?                          options and risks were discussed with the patient.  ?                          All questions were answered, and informed consent  ?                          was obtained. Prior Anticoagulants: The patient has  ?                          taken no previous anticoagulant or antiplatelet  ?                          agents. ASA Grade Assessment: II - A patient with  ?                          mild systemic disease. After reviewing the risks  ?                          and benefits, the patient was deemed in  ?                          satisfactory condition to undergo the procedure. ?  After obtaining informed consent, the colonoscope  ?                          was passed under direct vision. Throughout the  ?                          procedure, the patient's blood pressure, pulse, and  ?                          oxygen saturations were monitored continuously. The  ?                          CF [id number] #[id number] was introduced through the anus  ?                          and advanced  to the the terminal ileum. The  ?                          colonoscopy was performed without difficulty. The  ?                          patient tolerated the procedure well. The quality  ?                          of the bowel preparation was good. The terminal  ?                          ileum, ileocecal valve, appendiceal orifice, and  ?                          rectum were photographed. ?Scope In: 8:06:50 AM ?Scope Out: 8:24:12 AM ?Scope Withdrawal Time: 0 hours 12 minutes 0 seconds  ?Total Procedure Duration: 0 hours 17 minutes 22 seconds  ?Findings:                 The terminal ileum appeared normal. ?                          Non-bleeding internal hemorrhoids were found during  ?                          retroflexion. ?Complications:            No immediate complications. ?Estimated Blood Loss:     Estimated blood loss: none. ?Impression:               - The examined portion of the ileum was normal. ?                          - Non-bleeding internal hemorrhoids. ?                          - No specimens collected. ?Recommendation:           - Discharge patient to home (with escort). ?                          -  Repeat colonoscopy in 10 years for screening  ?                          purposes. ?                          - The findings and recommendations were discussed  ?                          with the patient. ?Georgian Co,  ?05/07/2021 8:29:51 AM ?

## 2021-05-08 ENCOUNTER — Telehealth: Payer: Self-pay | Admitting: Gastroenterology

## 2021-05-08 NOTE — Telephone Encounter (Addendum)
After hours call re: not feeling well after colonoscopy ? ?Screening colonoscopy with Dr. Lorenso Courier yesterday, 05/07/21. Exam was normal. Surveillance recommended in 10 years. ? ?Patient has not felt well since going home with anorexia, nausea, and a couple of episodes of vomiting. "I feel like I have a GI bug." Yesterday, kept her diet to mostly liquids after scrambled eggs "sat heavy." Having some loose yellow stools. No blood or mucous in the stool. No abdominal pain, constipation, bloating, or distension. No fevers, chills.  Husband, who accompanied her to the procedure yesterday, was feeling well until today and now he is starting to develop the same symptoms. ? ?Not unusual to have some GI symptoms as the microbiota recover from the bowel prep. However, she may also have a viral illness given her husband's similar symptoms. Symptoms sound atypical for ileus or perforation.  ? ?She will continue with supportive care, increase IV fluids, use PeptoBismal PRN. She agrees to go to the ER with any concerns for dehydration or progressive symptoms. She will call with an update tomorrow.  ? ?Ammie, would you please check on her Monday? If she is still having symptoms I would recommend trying to get her in for an office visit. Thank you.  ? ? ?

## 2021-05-09 ENCOUNTER — Ambulatory Visit
Admission: EM | Admit: 2021-05-09 | Discharge: 2021-05-09 | Disposition: A | Discharge status: Other Acute Inpatient Hospital | Payer: BC Managed Care – PPO

## 2021-05-09 ENCOUNTER — Emergency Department (HOSPITAL_COMMUNITY)
Admission: EM | Admit: 2021-05-09 | Discharge: 2021-05-09 | Disposition: A | Discharge status: Home / Self Care | Payer: BC Managed Care – PPO | Source: Home / Self Care | Attending: Emergency Medicine | Admitting: Emergency Medicine

## 2021-05-09 ENCOUNTER — Other Ambulatory Visit: Payer: Self-pay

## 2021-05-09 ENCOUNTER — Encounter (HOSPITAL_COMMUNITY): Payer: Self-pay | Admitting: Emergency Medicine

## 2021-05-09 ENCOUNTER — Emergency Department (HOSPITAL_COMMUNITY): Payer: BC Managed Care – PPO

## 2021-05-09 ENCOUNTER — Encounter: Payer: Self-pay | Admitting: Emergency Medicine

## 2021-05-09 DIAGNOSIS — K8010 Calculus of gallbladder with chronic cholecystitis without obstruction: Secondary | ICD-10-CM | POA: Diagnosis not present

## 2021-05-09 DIAGNOSIS — R1084 Generalized abdominal pain: Secondary | ICD-10-CM

## 2021-05-09 DIAGNOSIS — R1011 Right upper quadrant pain: Secondary | ICD-10-CM | POA: Diagnosis not present

## 2021-05-09 DIAGNOSIS — K219 Gastro-esophageal reflux disease without esophagitis: Secondary | ICD-10-CM | POA: Diagnosis not present

## 2021-05-09 DIAGNOSIS — K819 Cholecystitis, unspecified: Secondary | ICD-10-CM | POA: Diagnosis not present

## 2021-05-09 DIAGNOSIS — E039 Hypothyroidism, unspecified: Secondary | ICD-10-CM | POA: Diagnosis not present

## 2021-05-09 DIAGNOSIS — Z888 Allergy status to other drugs, medicaments and biological substances status: Secondary | ICD-10-CM | POA: Diagnosis not present

## 2021-05-09 DIAGNOSIS — Z8541 Personal history of malignant neoplasm of cervix uteri: Secondary | ICD-10-CM | POA: Diagnosis not present

## 2021-05-09 DIAGNOSIS — K7689 Other specified diseases of liver: Secondary | ICD-10-CM | POA: Insufficient documentation

## 2021-05-09 DIAGNOSIS — M47816 Spondylosis without myelopathy or radiculopathy, lumbar region: Secondary | ICD-10-CM | POA: Diagnosis not present

## 2021-05-09 DIAGNOSIS — Z881 Allergy status to other antibiotic agents status: Secondary | ICD-10-CM | POA: Diagnosis not present

## 2021-05-09 DIAGNOSIS — R112 Nausea with vomiting, unspecified: Secondary | ICD-10-CM | POA: Diagnosis not present

## 2021-05-09 DIAGNOSIS — R109 Unspecified abdominal pain: Secondary | ICD-10-CM | POA: Diagnosis not present

## 2021-05-09 DIAGNOSIS — K8000 Calculus of gallbladder with acute cholecystitis without obstruction: Secondary | ICD-10-CM | POA: Diagnosis not present

## 2021-05-09 DIAGNOSIS — Z9889 Other specified postprocedural states: Secondary | ICD-10-CM | POA: Diagnosis not present

## 2021-05-09 DIAGNOSIS — Z809 Family history of malignant neoplasm, unspecified: Secondary | ICD-10-CM | POA: Diagnosis not present

## 2021-05-09 DIAGNOSIS — E869 Volume depletion, unspecified: Secondary | ICD-10-CM | POA: Diagnosis not present

## 2021-05-09 DIAGNOSIS — K581 Irritable bowel syndrome with constipation: Secondary | ICD-10-CM | POA: Diagnosis not present

## 2021-05-09 DIAGNOSIS — G43909 Migraine, unspecified, not intractable, without status migrainosus: Secondary | ICD-10-CM | POA: Diagnosis not present

## 2021-05-09 DIAGNOSIS — Z79899 Other long term (current) drug therapy: Secondary | ICD-10-CM | POA: Diagnosis not present

## 2021-05-09 DIAGNOSIS — D259 Leiomyoma of uterus, unspecified: Secondary | ICD-10-CM | POA: Diagnosis not present

## 2021-05-09 DIAGNOSIS — E0590 Thyrotoxicosis, unspecified without thyrotoxic crisis or storm: Secondary | ICD-10-CM | POA: Diagnosis not present

## 2021-05-09 DIAGNOSIS — R718 Other abnormality of red blood cells: Secondary | ICD-10-CM | POA: Insufficient documentation

## 2021-05-09 DIAGNOSIS — F419 Anxiety disorder, unspecified: Secondary | ICD-10-CM | POA: Diagnosis not present

## 2021-05-09 DIAGNOSIS — K8020 Calculus of gallbladder without cholecystitis without obstruction: Secondary | ICD-10-CM | POA: Insufficient documentation

## 2021-05-09 DIAGNOSIS — Z0389 Encounter for observation for other suspected diseases and conditions ruled out: Secondary | ICD-10-CM | POA: Diagnosis not present

## 2021-05-09 DIAGNOSIS — K529 Noninfective gastroenteritis and colitis, unspecified: Secondary | ICD-10-CM | POA: Insufficient documentation

## 2021-05-09 DIAGNOSIS — F32A Depression, unspecified: Secondary | ICD-10-CM | POA: Diagnosis not present

## 2021-05-09 DIAGNOSIS — E876 Hypokalemia: Secondary | ICD-10-CM | POA: Diagnosis not present

## 2021-05-09 LAB — URINALYSIS, ROUTINE W REFLEX MICROSCOPIC
Bacteria, UA: NONE SEEN
Bilirubin Urine: NEGATIVE
Glucose, UA: NEGATIVE mg/dL
Hgb urine dipstick: NEGATIVE
Ketones, ur: 20 mg/dL — AB
Nitrite: NEGATIVE
Protein, ur: NEGATIVE mg/dL
Specific Gravity, Urine: 1.02 (ref 1.005–1.030)
pH: 5 (ref 5.0–8.0)

## 2021-05-09 LAB — COMPREHENSIVE METABOLIC PANEL
ALT: 37 U/L (ref 0–44)
AST: 45 U/L — ABNORMAL HIGH (ref 15–41)
Albumin: 4.2 g/dL (ref 3.5–5.0)
Alkaline Phosphatase: 76 U/L (ref 38–126)
Anion gap: 8 (ref 5–15)
BUN: 15 mg/dL (ref 6–20)
CO2: 25 mmol/L (ref 22–32)
Calcium: 9.5 mg/dL (ref 8.9–10.3)
Chloride: 105 mmol/L (ref 98–111)
Creatinine, Ser: 0.68 mg/dL (ref 0.44–1.00)
GFR, Estimated: >60 mL/min (ref >60)
Glucose, Bld: 110 mg/dL — ABNORMAL HIGH (ref 70–99)
Potassium: 3.8 mmol/L (ref 3.5–5.1)
Sodium: 138 mmol/L (ref 135–145)
Total Bilirubin: 1.8 mg/dL — ABNORMAL HIGH (ref 0.3–1.2)
Total Protein: 7.7 g/dL (ref 6.5–8.1)

## 2021-05-09 LAB — CBC
HCT: 46.3 % — ABNORMAL HIGH (ref 36.0–46.0)
Hemoglobin: 16.4 g/dL — ABNORMAL HIGH (ref 12.0–15.0)
MCH: 33.3 pg (ref 26.0–34.0)
MCHC: 35.4 g/dL (ref 30.0–36.0)
MCV: 93.9 fL (ref 80.0–100.0)
Platelets: 322 10*3/uL (ref 150–400)
RBC: 4.93 MIL/uL (ref 3.87–5.11)
RDW: 11.4 % — ABNORMAL LOW (ref 11.5–15.5)
WBC: 7.5 10*3/uL (ref 4.0–10.5)
nRBC: 0 % (ref 0.0–0.2)

## 2021-05-09 LAB — I-STAT BETA HCG BLOOD, ED (MC, WL, AP ONLY): I-stat hCG, quantitative: <5 m[IU]/mL (ref <5)

## 2021-05-09 LAB — LIPASE, BLOOD: Lipase: 33 U/L (ref 11–51)

## 2021-05-09 MED ORDER — ONDANSETRON 4 MG PO TBDP: 4.0000 mg | ORAL_TABLET | Freq: Three times a day (TID) | ORAL | 1 refills | Status: DC | PRN

## 2021-05-09 MED ORDER — SODIUM CHLORIDE 0.9 % IV BOLUS
1000.0000 mL | Freq: Once | INTRAVENOUS | Status: AC
  Administered 2021-05-09: 1000 mL via INTRAVENOUS

## 2021-05-09 MED ORDER — ONDANSETRON 4 MG PO TBDP
4.0000 mg | ORAL_TABLET | Freq: Once | ORAL | Status: AC
  Administered 2021-05-09: 4 mg via ORAL
  Filled 2021-05-09: qty 1

## 2021-05-09 MED ORDER — IOHEXOL 300 MG/ML  SOLN
100.0000 mL | Freq: Once | INTRAMUSCULAR | Status: AC | PRN
  Administered 2021-05-09: 100 mL via INTRAVENOUS

## 2021-05-09 MED ORDER — ONDANSETRON HCL 4 MG/2ML IJ SOLN
4.0000 mg | Freq: Once | INTRAMUSCULAR | Status: AC
  Administered 2021-05-09: 4 mg via INTRAVENOUS
  Filled 2021-05-09: qty 2

## 2021-05-09 NOTE — ED Triage Notes (Signed)
Patient reports colonoscopy x2 days ago. States N/V/D since that time with abdominal pain. Seen at UC this morning and sent for further evaluation with concern for ileus. ?

## 2021-05-09 NOTE — Discharge Instructions (Signed)
Your exam is very concerning for ileus vs perforation. ?Please head to the emergency room immediately for a further evaluation including abdominal CT. ?

## 2021-05-09 NOTE — ED Provider Notes (Signed)
?[facility] ? ? ? ?CSN: [id number] ?Arrival date & time: 05/09/21  0831 ? ? ?  ? ?History   ?Chief Complaint ?Chief Complaint  ?Patient presents with  ? Emesis  ? ? ?HPI ?Carrie Jensen is a 51 y.o. female.  ? ?Very pleasant 51 year old female presents today 2 days status post routine screening colonoscopy.  She states there were no findings and no polyps were removed.  This occurred Friday morning.  She states when she arrived home, she took a nap.  Upon awakening, she started with nausea and vomiting.  She has not been able to keep any food down since the procedure Friday afternoon.  She also reports intermittent diarrhea with black stools.  This morning she took a Zofran that she had at home.  States she has not vomited since the Zofran, but is very queasy and nauseated.  Denies fever.  States she is in moderately severe abdominal pain.  Patient reports she called the nurse on call yesterday, and was told to "just monitor", but was then called by the nurse today and told that "numerous other people with the procedure the same day are having same symptoms".  Patient is very anxious, took a Xanax prior to arrival. ? ? ?Emesis ?Associated symptoms: abdominal pain and diarrhea   ? ?Past Medical History:  ?Diagnosis Date  ? Anxiety   ? Cancer [facility])   ? CIN II (cervical intraepithelial neoplasia II) 1998  ? LEEP  ? Depression   ? Endometriosis   ? GERD (gastroesophageal reflux disease)   ? Hyperthyroidism   ? DR. BALAN  ? IBS (irritable bowel syndrome)   ? Migraines   ? Plantar fasciitis of left foot   ? ? ?Patient Active Problem List  ? Diagnosis Date Noted  ? Nevus of choroid of right eye 12/09/2016  ? Anxiety   ? Endometriosis   ? Hyperthyroidism   ? ? ?Past Surgical History:  ?Procedure Laterality Date  ? CERVICAL BIOPSY  W/ LOOP ELECTRODE EXCISION  01/18/1996  ? CIN II  ? MOHS SURGERY    ? OVARIAN CYSTECTOMY, PARTIAL LSO, FULGURATION OF ENDOMETRIOSIS    ? ? ?OB History   ? ? Gravida  ?0  ? Para   ?0  ? Term  ?0  ? Preterm  ?0  ? AB  ?0  ? Living  ?0  ?  ? ? SAB  ?0  ? IAB  ?0  ? Ectopic  ?0  ? Multiple  ?0  ? Live Births  ?0  ?   ?  ?  ? ? ? ?Home Medications   ? ?Prior to Admission medications   ?Medication Sig Start Date End Date Taking? Authorizing Provider  ?ALPRAZolam (XANAX) 0.25 MG tablet TAKE ONE TABLET BY MOUTH EVERY NIGHT AT BEDTIME AS NEEDED FOR ANXIETY OR SLEEP 10/26/20  Yes Tamela Gammon, NP  ?Ascorbic Acid (VITAMIN C) POWD Take by mouth.   Yes [provider]  ?Calcium Polycarbophil 625 MG CHEW Chew by mouth.   Yes [provider]  ?Cholecalciferol (VITAMIN D3) 25 MCG (1000 UT) CAPS Take by mouth 2 (two) times daily.   Yes [provider]  ?Melatonin 10 MG TABS Take by mouth.   Yes [provider]  ?omeprazole (PRILOSEC) 20 MG capsule Take by mouth.   Yes [provider]  ?Plecanatide (TRULANCE) 3 MG TABS Take 3 mg by mouth every morning. 03/08/21  Yes Tamela Gammon, NP  ?Probiotic Product (PROBIOTIC-10  PO) Take by mouth.   Yes [provider]  ?promethazine (PHENERGAN) 25 MG tablet Take 1 tablet (25 mg total) by mouth every 6 (six) hours as needed for nausea. 03/08/21  Yes Marny Lowenstein A, NP  ?SUMAtriptan (IMITREX) 50 MG tablet Take 1 tablet (50 mg total) by mouth every 2 (two) hours as needed for migraine. May repeat in 2 hours if headache persists or recurs. 03/08/21  Yes Marny Lowenstein A, NP  ?triamcinolone cream (KENALOG) 0.1 % Apply 1 application topically 3 (three) times daily. 06/17/19  Yes Scot Jun, FNP  ?venlafaxine XR (EFFEXOR-XR) 150 MG 24 hr capsule TAKE ONE CAPSULE BY MOUTH ONCE DAILY WITH BREAKFAST 03/08/21  Yes Marny Lowenstein A, NP  ?doxycycline (VIBRAMYCIN) 100 MG capsule Take 1 capsule (100 mg total) by mouth 2 (two) times daily. ?Patient not taking: Reported on 03/08/2021 06/17/19   Scot Jun, FNP  ?hydrocortisone cream 1 % Apply to affected area 2 times daily ?Patient not taking: Reported on  03/08/2021 07/04/20   Ward, Lenise Arena, PA-C  ?meloxicam (MOBIC) 15 MG tablet Take 1 tablet (15 mg total) by mouth daily. ?Patient not taking: Reported on 03/08/2021 01/04/19   Edrick Kins, DPM  ?Multiple Vitamin (MULTIVITAMIN) capsule Take 1 capsule by mouth daily. ?Patient not taking: Reported on 03/08/2021    [provider]  ?norethindrone-ethinyl estradiol (JUNEL 1/20) 1-20 MG-MCG tablet TAKE ONE TABLET BY MOUTH DAILY CONTINUOUSLY ?Patient not taking: Reported on 04/16/2021 03/18/20   Tamela Gammon, NP  ? ? ?Family History ?Family History  ?Problem Relation Age of Onset  ? Colon polyps Brother   ? Cancer Maternal Aunt   ?     OVARIAN  ? Heart disease Maternal Grandmother   ? Stroke Maternal Grandmother   ? Diabetes Maternal Grandfather   ? Hypertension Maternal Grandfather   ? Heart disease Maternal Grandfather   ? Heart disease Paternal Grandmother   ? Heart disease Paternal Grandfather   ? Breast cancer Neg Hx   ? Colon cancer Neg Hx   ? Esophageal cancer Neg Hx   ? Stomach cancer Neg Hx   ? Rectal cancer Neg Hx   ? ? ?Social History ?Social History  ? ?Tobacco Use  ? Smoking status: Never  ? Smokeless tobacco: Never  ?Vaping Use  ? Vaping Use: Never used  ?Substance Use Topics  ? Alcohol use: No  ? Drug use: No  ? ? ? ?Allergies   ?Erythromycin ? ? ?Review of Systems ?Review of Systems  ?Gastrointestinal:  Positive for abdominal pain, diarrhea, nausea and vomiting.  ? ? ?Physical Exam ?Triage Vital Signs ?ED Triage Vitals  ?Enc Vitals Group  ?   BP 05/09/21 0857 (!) 152/74  ?   Pulse Rate 05/09/21 0857 71  ?   Resp 05/09/21 0857 18  ?   Temp 05/09/21 0857 98.3 ?F (36.8 ?C)  ?   Temp Source 05/09/21 0857 Oral  ?   SpO2 05/09/21 0857 98 %  ?   Weight 05/09/21 0859 154 lb 15.7 oz (70.3 kg)  ?   Height 05/09/21 0859 '5\' 1"'$  (1.549 m)  ?   Head Circumference --   ?   Peak Flow --   ?   Pain Score 05/09/21 0858 8  ?   Pain Loc --   ?   Pain Edu? --   ?   Excl. in [place]? --   ? ?No data found. ? ?Updated Vital  Signs ?BP Marland Kitchen)  152/74 (BP Location: Right Arm)   Pulse 71   Temp 98.3 ?F (36.8 ?C) (Oral)   Resp 18   Ht '5\' 1"'$  (1.549 m)   Wt 154 lb 15.7 oz (70.3 kg)   LMP  (LMP Unknown) Comment: takes birth control cont  SpO2 98%   BMI 29.28 kg/m?  ? ?Visual Acuity ?Right Eye Distance:   ?Left Eye Distance:   ?Bilateral Distance:   ? ?Right Eye Near:   ?Left Eye Near:    ?Bilateral Near:    ? ?Physical Exam ?Vitals and nursing note reviewed.  ?Constitutional:   ?   Appearance: Normal appearance. She is not toxic-appearing.  ?HENT:  ?   Head: Normocephalic.  ?Cardiovascular:  ?   Rate and Rhythm: Normal rate.  ?Pulmonary:  ?   Effort: Pulmonary effort is normal. No respiratory distress.  ?Abdominal:  ?   General: Abdomen is flat. There is no distension.  ?   Palpations: Abdomen is soft.  ?   Tenderness: There is abdominal tenderness (severe tenderness, pt guarding and wincing to percussion alone). There is guarding. There is no rebound.  ?   Comments: Normal bowel sounds to LL and LU quadrants ?Decreased bowel sounds to RUQ ?ABSENT bowel sounds to RLQ  ?Neurological:  ?   Mental Status: She is alert.  ?Psychiatric:  ?   Comments: Very anxious, crying in room  ? ? ? ?UC Treatments / Results  ?Labs ?(all labs ordered are listed, but only abnormal results are displayed) ?Labs Reviewed - No data to display ? ?EKG ? ? ?Radiology ?No results found. ? ?Procedures ?Procedures (including critical care time) ? ?Medications Ordered in UC ?Medications - No data to display ? ?Initial Impression / Assessment and Plan / UC Course  ?I have reviewed the triage vital signs and the nursing notes. ? ?Pertinent labs & imaging results that were available during my care of the patient were reviewed by me and considered in my medical decision making (see chart for details). ? ?  ? ?S/P colonoscopy - pt reports there were no polyps removed. With decreased BS on exam and what appears to be 10/10 pain to percussion alone, must have further workup in  ER to exclude perforation or ileus. Pt is stable for DC to ER via personal vehicle. No signs of peritonitis on exam ?Generalized abdominal pain with N/V/D - as above. ER workup needed for further assessment.

## 2021-05-09 NOTE — ED Notes (Signed)
Patient transported to CT 

## 2021-05-09 NOTE — Telephone Encounter (Signed)
I called the patient Sunday morning, 05/09/21 for a symptom update. She is still not feeling well with the same symptoms. Husband is now asymptomatic. She will go to an Urgent Care this morning for additional evaluation. ? ?Carrie Jensen, would you please call her Monday for an update? ?

## 2021-05-09 NOTE — Discharge Instructions (Addendum)
Work-up without any acute findings in the abdomen no signs of any perforation no significant blood loss.  Labs are very normal.  CT had some incidental findings to include some gallstones.  And some spots on the liver which I think are benign and something in the uterus area which I think may be a fibroid.  He can follow-up with your regular doctor regarding these findings.  You can continue the Imodium also Zofran ODT provided.  Urine also sent for urine culture.  You will be notified if it shows signs of urinary tract infection.  Have printed that for you so that you are not locked into a pharmacy.  We did encourage fluids with sugar small amounts frequently and then advance to a bland diet.  Work note provided.  Return for any new or worse symptoms. ? ?None of the symptoms seem to be directly correlated to the colonoscopy as far as any complications from that. ?

## 2021-05-09 NOTE — ED Provider Notes (Signed)
?[facility] ?Provider Note ? ? ?CSN: [id number] ?Arrival date & time: 05/09/21  0944 ? ?  ? ?History ? ?Chief Complaint  ?Patient presents with  ? Abdominal Pain  ? ? ?Carrie Jensen is a 51 y.o. female. ? ?Patient status post colonoscopy on April 21.  Later that day patient started with nausea vomiting and diarrhea.  Patient colonoscopy was done by [facility] gastroenterology Dr. Lorenso Courier.  This was a screening colonoscopy.  No biopsies were done.  Colonoscopy report is available.  Patient states that today she just had 1 episode of vomiting no further diarrhea yesterday she had vomiting and diarrhea on and off throughout the day.  No red blood in either 1.  Is associated with some abdominal cramping.  No fevers.  ? ?Patient went to urgent care originally and then was referred here. ? ?Past medical history significant for hypothyroidism anxiety endometriosis migraines irritable bowel syndrome gastroesophageal reflux disease. ? ? ?  ? ?Home Medications ?Prior to Admission medications   ?Medication Sig Start Date End Date Taking? Authorizing Provider  ?ALPRAZolam (XANAX) 0.25 MG tablet TAKE ONE TABLET BY MOUTH EVERY NIGHT AT BEDTIME AS NEEDED FOR ANXIETY OR SLEEP 10/26/20   Tamela Gammon, NP  ?Ascorbic Acid (VITAMIN C) POWD Take by mouth.    [provider]  ?Calcium Polycarbophil 625 MG CHEW Chew by mouth.    [provider]  ?Cholecalciferol (VITAMIN D3) 25 MCG (1000 UT) CAPS Take by mouth 2 (two) times daily.    [provider]  ?doxycycline (VIBRAMYCIN) 100 MG capsule Take 1 capsule (100 mg total) by mouth 2 (two) times daily. ?Patient not taking: Reported on 03/08/2021 06/17/19   Scot Jun, FNP  ?hydrocortisone cream 1 % Apply to affected area 2 times daily ?Patient not taking: Reported on 03/08/2021 07/04/20   Ward, Lenise Arena, PA-C  ?Melatonin 10 MG TABS Take by mouth.    [provider]  ?meloxicam (MOBIC) 15 MG tablet Take 1  tablet (15 mg total) by mouth daily. ?Patient not taking: Reported on 03/08/2021 01/04/19   Edrick Kins, DPM  ?Multiple Vitamin (MULTIVITAMIN) capsule Take 1 capsule by mouth daily. ?Patient not taking: Reported on 03/08/2021    [provider]  ?norethindrone-ethinyl estradiol (JUNEL 1/20) 1-20 MG-MCG tablet TAKE ONE TABLET BY MOUTH DAILY CONTINUOUSLY ?Patient not taking: Reported on 04/16/2021 03/18/20   Marny Lowenstein A, NP  ?omeprazole (PRILOSEC) 20 MG capsule Take by mouth.    [provider]  ?Plecanatide (TRULANCE) 3 MG TABS Take 3 mg by mouth every morning. 03/08/21   Tamela Gammon, NP  ?Probiotic Product (PROBIOTIC-10 PO) Take by mouth.    [provider]  ?promethazine (PHENERGAN) 25 MG tablet Take 1 tablet (25 mg total) by mouth every 6 (six) hours as needed for nausea. 03/08/21   Tamela Gammon, NP  ?SUMAtriptan (IMITREX) 50 MG tablet Take 1 tablet (50 mg total) by mouth every 2 (two) hours as needed for migraine. May repeat in 2 hours if headache persists or recurs. 03/08/21   Tamela Gammon, NP  ?triamcinolone cream (KENALOG) 0.1 % Apply 1 application topically 3 (three) times daily. 06/17/19   Scot Jun, FNP  ?venlafaxine XR (EFFEXOR-XR) 150 MG 24 hr capsule TAKE ONE CAPSULE BY MOUTH ONCE DAILY WITH BREAKFAST 03/08/21   Tamela Gammon, NP  ?   ? ?Allergies    ?Erythromycin   ? ?Review of Systems   ?Review of Systems  ?  Constitutional:  Negative for chills and fever.  ?HENT:  Negative for ear pain and sore throat.   ?Eyes:  Negative for pain and visual disturbance.  ?Respiratory:  Negative for cough and shortness of breath.   ?Cardiovascular:  Negative for chest pain and palpitations.  ?Gastrointestinal:  Positive for abdominal pain, diarrhea, nausea and vomiting.  ?Genitourinary:  Negative for dysuria and hematuria.  ?Musculoskeletal:  Negative for arthralgias and back pain.  ?Skin:  Negative for color change and rash.  ?Neurological:  Negative for  seizures and syncope.  ?All other systems reviewed and are negative. ? ?Physical Exam ?Updated Vital Signs ?BP (!) 131/95   Pulse 72   Temp 97.8 ?F (36.6 ?C) (Oral)   Resp 15   LMP  (LMP Unknown) Comment: takes birth control cont  SpO2 100%  ?Physical Exam ?Vitals and nursing note reviewed.  ?Constitutional:   ?   General: She is not in acute distress. ?   Appearance: She is well-developed. She is not ill-appearing or toxic-appearing.  ?HENT:  ?   Head: Normocephalic and atraumatic.  ?Eyes:  ?   Conjunctiva/sclera: Conjunctivae normal.  ?Cardiovascular:  ?   Rate and Rhythm: Normal rate and regular rhythm.  ?   Heart sounds: No murmur heard. ?Pulmonary:  ?   Effort: Pulmonary effort is normal. No respiratory distress.  ?   Breath sounds: Normal breath sounds.  ?Abdominal:  ?   General: Abdomen is flat. There is no distension.  ?   Palpations: Abdomen is soft.  ?   Tenderness: There is no abdominal tenderness.  ?   Hernia: No hernia is present.  ?Musculoskeletal:     ?   General: No swelling.  ?   Cervical back: Neck supple.  ?Skin: ?   General: Skin is warm and dry.  ?   Capillary Refill: Capillary refill takes less than 2 seconds.  ?Neurological:  ?   General: No focal deficit present.  ?   Mental Status: She is alert.  ?Psychiatric:     ?   Mood and Affect: Mood normal.  ? ? ?ED Results / Procedures / Treatments   ?Labs ?(all labs ordered are listed, but only abnormal results are displayed) ?Labs Reviewed  ?COMPREHENSIVE METABOLIC PANEL - Abnormal; Notable for the following components:  ?    Result Value  ? Glucose, Bld 110 (*)   ? AST 45 (*)   ? Total Bilirubin 1.8 (*)   ? All other components within normal limits  ?CBC - Abnormal; Notable for the following components:  ? Hemoglobin 16.4 (*)   ? HCT 46.3 (*)   ? RDW 11.4 (*)   ? All other components within normal limits  ?URINALYSIS, ROUTINE W REFLEX MICROSCOPIC - Abnormal; Notable for the following components:  ? APPearance HAZY (*)   ? Ketones, ur 20 (*)    ? Leukocytes,Ua LARGE (*)   ? All other components within normal limits  ?LIPASE, BLOOD  ?I-STAT BETA HCG BLOOD, ED ([facility], [facility], [facility] ONLY)  ? ? ?EKG ?None ? ?Radiology ?CT Abdomen Pelvis W Contrast ? ?Result Date: 05/09/2021 ?CLINICAL DATA:  Status post colonoscopy on Friday with vomiting, diarrhea and nausea. EXAM: CT ABDOMEN AND PELVIS WITH CONTRAST TECHNIQUE: Multidetector CT imaging of the abdomen and pelvis was performed using the standard protocol following bolus administration of intravenous contrast. RADIATION DOSE REDUCTION: This exam was performed according to the departmental dose-optimization program which includes automated exposure control, adjustment of the mA and/or kV according  to patient size and/or use of iterative reconstruction technique. CONTRAST:  185m OMNIPAQUE IOHEXOL 300 MG/ML  SOLN COMPARISON:  None. FINDINGS: Lower chest: No acute abnormality. Hepatobiliary: There is a 8.2 x 7.2 cm simple cyst in the right lobe liver. Smaller subcentimeter cysts are identified in the liver. In the inferior anterior liver, there is a 2.8 x 1.5 cm enhancing mass which become isodense delayed images, favor hemangioma. Gallstones are noted in the gallbladder. The gallbladder is distended. There is no inflammation around the gallbladder. The biliary tree is normal. Pancreas: Unremarkable. No pancreatic ductal dilatation or surrounding inflammatory changes. Spleen: Normal in size without focal abnormality. Adrenals/Urinary Tract: Adrenal glands are unremarkable. Kidneys are normal, without renal calculi, focal lesion, or hydronephrosis. Bladder is unremarkable. Stomach/Bowel: Stomach is within normal limits. Appendix appears normal. No evidence of bowel wall thickening, distention, or inflammatory changes. Vascular/Lymphatic: Aortic atherosclerosis. No enlarged abdominal or pelvic lymph nodes. Reproductive: There is a 5.8 x 4.3 cm mass extending posteriorly from the uterus question uterine fibroid. The bilateral  adnexa are normal. Other: None.  No free air. Musculoskeletal: Degenerative joint changes of L5-S1 is noted. IMPRESSION: 1. No acute abnormality identified in the abdomen or pelvis. No free air. 2. Cholelithiasis with

## 2021-05-09 NOTE — ED Provider Triage Note (Signed)
Emergency Medicine Provider Triage Evaluation Note ? ?Carrie Jensen , a 51 y.o. female  was evaluated in triage.  Pt complains of abdominal pain, black stools (small, bubble looking), nausea/vomiting after colonoscopy (Dr. Tarri Glenn) on Friday (procedure went well, no biopsy taken, given 10 year return time). Prior abdominal surgery- ovarian cyst removal. Not on thinners.  ? ?Review of Systems  ?Positive: Black stools, abdominal pain, vomiting  ?Negative: Fever (feels cold/hot) ? ?Physical Exam  ?BP (!) 165/93 (BP Location: Left Arm)   Temp 97.8 ?F (36.6 ?C) (Oral)   Resp 18   LMP  (LMP Unknown) Comment: takes birth control cont  SpO2 100%  ?Gen:   Awake, no distress   ?Resp:  Normal effort  ?MSK:   Moves extremities without difficulty  ?Other:   ? ?Medical Decision Making  ?Medically screening exam initiated at 10:23 AM.  Appropriate orders placed.  Atia Haupt Compton was informed that the remainder of the evaluation will be completed by another provider, this initial triage assessment does not replace that evaluation, and the importance of remaining in the ED until their evaluation is complete. ? ? ?  ?Tacy Learn, PA-C ?05/09/21 1025 ? ?

## 2021-05-09 NOTE — ED Notes (Signed)
Patient is being discharged from the Urgent Care and sent to the Emergency Department via private vehilce . Per Whitney Crain,PA-C patient is in need of higher level of care due to further evaluation. Patient is aware and verbalizes understanding of plan of care.  ?Vitals:  ? 05/09/21 0857  ?BP: (!) 152/74  ?Pulse: 71  ?Resp: 18  ?Temp: 98.3 ?F (36.8 ?C)  ?SpO2: 98%  ?  ?

## 2021-05-09 NOTE — ED Triage Notes (Signed)
Patient had a colonoscopy on Friday, came home started vomiting, diarrhea, nausea since.  Spoke with on call GI advised to be seen.  Patient has not been able to keep anything down.  Very fatigued. ?

## 2021-05-09 NOTE — ED Notes (Signed)
Pt states understanding of dc instructions, importance of follow up, work note, and prescription. Pt denies questions or concerns upon dc. Pt declined wheelchair assistance upon dc. Pt ambulated out of ed w/ steady gait. No belongings left in room upon dc.  

## 2021-05-10 ENCOUNTER — Telehealth: Payer: Self-pay | Admitting: Internal Medicine

## 2021-05-10 NOTE — Telephone Encounter (Signed)
Called the patient to see how she is feeling. Her vomiting has improved, but she is still feeling some abdominal discomfort and having some loose stools. She is seeing small specks of black in her stools. She has not taken any Pepto Bismol or any antidiarrheal medications. She is still taking Zofran as given by the ED to help with N&V. I encouraged her to use Tylenol for pain and to use Gas-X PRN. The black specks in her stool may be due to some mucosal disruption from the colonoscopy procedure so as long as this improves over the next few days, I don't think there is anything else concerning happening. Her CT A/P was negative for perforation, which is reassuring.  ? ?Carrie Jensen, let's plan to call her in 2 days and see how she is feeling. ?

## 2021-05-10 NOTE — Telephone Encounter (Signed)
Returned pt call. States she has not been able to keep fluids down. Has been taking Zofran without relief of her N/V. Pt further adds she feels horrible, dizzy and weak. Pt has been advised to proceed to ED for possible need of IV fluid replenishment, further evaluation and treatment. Routing this message to Dr. Lorenso Courier for continuity of care. ?

## 2021-05-10 NOTE — Telephone Encounter (Signed)
Spoke with pt.  She states she took Tylenol and Zofran, then Gas-x per Dr. Libby Maw recommendations.  She tried ice chips only by mouth.  She is vomiting again; clear, yellow emesis and c/o abdominal burning.  ?

## 2021-05-10 NOTE — Telephone Encounter (Signed)
Disregard previous messeage. See telephone note 4/22 problem is being addressed ?

## 2021-05-10 NOTE — Telephone Encounter (Signed)
Patient is calling to follow up on previous message 

## 2021-05-10 NOTE — Telephone Encounter (Signed)
Inbound call from patient. Reports she can not keep anything down and vomitting. Patient had procedure 4/21 and have been to ED and Urgent care 4/23 ?

## 2021-05-10 NOTE — Telephone Encounter (Signed)
Dr. Lorenso Courier - please review ED and Urgent Care records. Before I call pt with a symptom update, wanted to ensure you did not have any further recommendations. ? ?Review of pt chart indicates she proceeded to both Urgent Care and ED 4/23. Routing this message to Dr. Lorenso Courier for her to review UC/ED notes, labs and imaging PRIOR to my call to determine if she has any further recommendations based on her review. ? ?CT as below: ? ?IMPRESSION: ?1. No acute abnormality identified in the abdomen or pelvis. No free ?air. ?2. Cholelithiasis without evidence of acute cholecystitis. ?3. 8.2 cm simple cyst in the right lobe liver. ?4. 2.8 cm enhancing mass in the inferior anterior liver, favor ?hemangioma. ?5. 5.8 cm mass extending posteriorly from the uterus question ?uterine fibroid. ?

## 2021-05-11 ENCOUNTER — Emergency Department (HOSPITAL_COMMUNITY): Payer: BC Managed Care – PPO

## 2021-05-11 ENCOUNTER — Other Ambulatory Visit: Payer: Self-pay

## 2021-05-11 ENCOUNTER — Inpatient Hospital Stay (HOSPITAL_COMMUNITY)
Admission: EM | Admit: 2021-05-11 | Discharge: 2021-05-15 | DRG: 419 | Disposition: A | Discharge status: Home / Self Care | Payer: BC Managed Care – PPO | Attending: Internal Medicine | Admitting: Internal Medicine

## 2021-05-11 ENCOUNTER — Telehealth: Payer: Self-pay

## 2021-05-11 ENCOUNTER — Encounter (HOSPITAL_COMMUNITY): Payer: Self-pay

## 2021-05-11 DIAGNOSIS — K219 Gastro-esophageal reflux disease without esophagitis: Secondary | ICD-10-CM | POA: Diagnosis present

## 2021-05-11 DIAGNOSIS — Z8541 Personal history of malignant neoplasm of cervix uteri: Secondary | ICD-10-CM

## 2021-05-11 DIAGNOSIS — R109 Unspecified abdominal pain: Secondary | ICD-10-CM

## 2021-05-11 DIAGNOSIS — F419 Anxiety disorder, unspecified: Secondary | ICD-10-CM | POA: Diagnosis present

## 2021-05-11 DIAGNOSIS — G43909 Migraine, unspecified, not intractable, without status migrainosus: Secondary | ICD-10-CM | POA: Diagnosis present

## 2021-05-11 DIAGNOSIS — Z809 Family history of malignant neoplasm, unspecified: Secondary | ICD-10-CM

## 2021-05-11 DIAGNOSIS — K8000 Calculus of gallbladder with acute cholecystitis without obstruction: Principal | ICD-10-CM | POA: Diagnosis present

## 2021-05-11 DIAGNOSIS — Z888 Allergy status to other drugs, medicaments and biological substances status: Secondary | ICD-10-CM

## 2021-05-11 DIAGNOSIS — F32A Depression, unspecified: Secondary | ICD-10-CM | POA: Diagnosis present

## 2021-05-11 DIAGNOSIS — R112 Nausea with vomiting, unspecified: Secondary | ICD-10-CM | POA: Diagnosis not present

## 2021-05-11 DIAGNOSIS — R1084 Generalized abdominal pain: Principal | ICD-10-CM

## 2021-05-11 DIAGNOSIS — E0590 Thyrotoxicosis, unspecified without thyrotoxic crisis or storm: Secondary | ICD-10-CM | POA: Diagnosis present

## 2021-05-11 DIAGNOSIS — E876 Hypokalemia: Secondary | ICD-10-CM | POA: Diagnosis present

## 2021-05-11 DIAGNOSIS — Z79899 Other long term (current) drug therapy: Secondary | ICD-10-CM

## 2021-05-11 DIAGNOSIS — Z881 Allergy status to other antibiotic agents status: Secondary | ICD-10-CM

## 2021-05-11 DIAGNOSIS — D259 Leiomyoma of uterus, unspecified: Secondary | ICD-10-CM | POA: Diagnosis present

## 2021-05-11 DIAGNOSIS — E869 Volume depletion, unspecified: Secondary | ICD-10-CM | POA: Diagnosis present

## 2021-05-11 DIAGNOSIS — K8020 Calculus of gallbladder without cholecystitis without obstruction: Secondary | ICD-10-CM

## 2021-05-11 DIAGNOSIS — R1011 Right upper quadrant pain: Secondary | ICD-10-CM

## 2021-05-11 DIAGNOSIS — E039 Hypothyroidism, unspecified: Secondary | ICD-10-CM | POA: Diagnosis present

## 2021-05-11 DIAGNOSIS — K581 Irritable bowel syndrome with constipation: Secondary | ICD-10-CM | POA: Diagnosis present

## 2021-05-11 LAB — URINALYSIS, ROUTINE W REFLEX MICROSCOPIC
Bilirubin Urine: NEGATIVE
Glucose, UA: NEGATIVE mg/dL
Ketones, ur: 80 mg/dL — AB
Leukocytes,Ua: NEGATIVE
Nitrite: NEGATIVE
Protein, ur: 30 mg/dL — AB
Specific Gravity, Urine: 1.027 (ref 1.005–1.030)
pH: 5 (ref 5.0–8.0)

## 2021-05-11 LAB — COMPREHENSIVE METABOLIC PANEL
ALT: 28 U/L (ref 0–44)
AST: 28 U/L (ref 15–41)
Albumin: 4 g/dL (ref 3.5–5.0)
Alkaline Phosphatase: 68 U/L (ref 38–126)
Anion gap: 13 (ref 5–15)
BUN: 19 mg/dL (ref 6–20)
CO2: 19 mmol/L — ABNORMAL LOW (ref 22–32)
Calcium: 9.5 mg/dL (ref 8.9–10.3)
Chloride: 106 mmol/L (ref 98–111)
Creatinine, Ser: 0.56 mg/dL (ref 0.44–1.00)
GFR, Estimated: >60 mL/min (ref >60)
Glucose, Bld: 97 mg/dL (ref 70–99)
Potassium: 3.3 mmol/L — ABNORMAL LOW (ref 3.5–5.1)
Sodium: 138 mmol/L (ref 135–145)
Total Bilirubin: 1.8 mg/dL — ABNORMAL HIGH (ref 0.3–1.2)
Total Protein: 7.2 g/dL (ref 6.5–8.1)

## 2021-05-11 LAB — CBC
HCT: 43.7 % (ref 36.0–46.0)
Hemoglobin: 15.7 g/dL — ABNORMAL HIGH (ref 12.0–15.0)
MCH: 32.9 pg (ref 26.0–34.0)
MCHC: 35.9 g/dL (ref 30.0–36.0)
MCV: 91.6 fL (ref 80.0–100.0)
Platelets: 342 10*3/uL (ref 150–400)
RBC: 4.77 MIL/uL (ref 3.87–5.11)
RDW: 11.2 % — ABNORMAL LOW (ref 11.5–15.5)
WBC: 5.5 10*3/uL (ref 4.0–10.5)
nRBC: 0 % (ref 0.0–0.2)

## 2021-05-11 LAB — URINE CULTURE

## 2021-05-11 LAB — BILIRUBIN, FRACTIONATED(TOT/DIR/INDIR)
Bilirubin, Direct: 0.7 mg/dL — ABNORMAL HIGH (ref 0.0–0.2)
Indirect Bilirubin: 1.4 mg/dL — ABNORMAL HIGH (ref 0.3–0.9)
Total Bilirubin: 2.1 mg/dL — ABNORMAL HIGH (ref 0.3–1.2)

## 2021-05-11 LAB — LIPASE, BLOOD: Lipase: 37 U/L (ref 11–51)

## 2021-05-11 MED ORDER — DEXAMETHASONE SODIUM PHOSPHATE 4 MG/ML IJ SOLN
4.0000 mg | Freq: Once | INTRAMUSCULAR | Status: AC
  Administered 2021-05-11: 4 mg via INTRAVENOUS
  Filled 2021-05-11: qty 1

## 2021-05-11 MED ORDER — LACTATED RINGERS IV SOLN: INTRAVENOUS | Status: DC

## 2021-05-11 MED ORDER — DICYCLOMINE HCL 10 MG/ML IM SOLN
20.0000 mg | Freq: Once | INTRAMUSCULAR | Status: AC
  Administered 2021-05-11: 20 mg via INTRAMUSCULAR
  Filled 2021-05-11: qty 2

## 2021-05-11 MED ORDER — ONDANSETRON HCL 4 MG/2ML IJ SOLN
4.0000 mg | Freq: Four times a day (QID) | INTRAMUSCULAR | Status: DC | PRN
  Administered 2021-05-12 – 2021-05-14 (×4): 4 mg via INTRAVENOUS
  Filled 2021-05-11 (×5): qty 2

## 2021-05-11 MED ORDER — ONDANSETRON HCL 4 MG PO TABS
4.0000 mg | ORAL_TABLET | Freq: Four times a day (QID) | ORAL | Status: DC | PRN
  Administered 2021-05-14: 4 mg via ORAL
  Filled 2021-05-11: qty 1

## 2021-05-11 MED ORDER — ACETAMINOPHEN 650 MG RE SUPP: 650.0000 mg | Freq: Four times a day (QID) | RECTAL | Status: DC | PRN

## 2021-05-11 MED ORDER — HYDROMORPHONE HCL 1 MG/ML IJ SOLN
1.0000 mg | INTRAMUSCULAR | Status: DC | PRN
  Administered 2021-05-11 – 2021-05-12 (×4): 1 mg via INTRAVENOUS
  Filled 2021-05-11 (×4): qty 1

## 2021-05-11 MED ORDER — LORAZEPAM 2 MG/ML IJ SOLN
1.0000 mg | Freq: Once | INTRAMUSCULAR | Status: AC
  Administered 2021-05-11: 1 mg via INTRAVENOUS
  Filled 2021-05-11: qty 1

## 2021-05-11 MED ORDER — ONDANSETRON HCL 4 MG/2ML IJ SOLN
4.0000 mg | Freq: Once | INTRAMUSCULAR | Status: AC
Start: 2021-05-11 — End: 2021-05-11
  Administered 2021-05-11: 4 mg via INTRAVENOUS
  Filled 2021-05-11: qty 2

## 2021-05-11 MED ORDER — METOCLOPRAMIDE HCL 5 MG/ML IJ SOLN
10.0000 mg | Freq: Once | INTRAMUSCULAR | Status: AC
  Administered 2021-05-11: 10 mg via INTRAVENOUS
  Filled 2021-05-11: qty 2

## 2021-05-11 MED ORDER — PANTOPRAZOLE SODIUM 40 MG IV SOLR
40.0000 mg | INTRAVENOUS | Status: DC
  Administered 2021-05-12 – 2021-05-15 (×4): 40 mg via INTRAVENOUS
  Filled 2021-05-11 (×5): qty 10

## 2021-05-11 MED ORDER — POTASSIUM CHLORIDE 10 MEQ/100ML IV SOLN
10.0000 meq | INTRAVENOUS | Status: AC
  Administered 2021-05-11 (×2): 10 meq via INTRAVENOUS
  Filled 2021-05-11 (×2): qty 100

## 2021-05-11 MED ORDER — PANTOPRAZOLE 80MG IVPB - SIMPLE MED
80.0000 mg | Freq: Once | INTRAVENOUS | Status: AC
  Administered 2021-05-11: 80 mg via INTRAVENOUS
  Filled 2021-05-11: qty 80

## 2021-05-11 MED ORDER — MORPHINE SULFATE (PF) 4 MG/ML IV SOLN
4.0000 mg | Freq: Once | INTRAVENOUS | Status: AC
  Administered 2021-05-11: 4 mg via INTRAVENOUS
  Filled 2021-05-11: qty 1

## 2021-05-11 MED ORDER — ACETAMINOPHEN 325 MG PO TABS: 650.0000 mg | ORAL_TABLET | Freq: Four times a day (QID) | ORAL | Status: DC | PRN

## 2021-05-11 MED ORDER — POTASSIUM CHLORIDE IN NACL 20-0.9 MEQ/L-% IV SOLN
INTRAVENOUS | Status: AC
Start: 2021-05-11 — End: 2021-05-11
  Filled 2021-05-11: qty 1000

## 2021-05-11 MED ORDER — ONDANSETRON 4 MG PO TBDP
4.0000 mg | ORAL_TABLET | Freq: Once | ORAL | Status: AC | PRN
  Administered 2021-05-11: 4 mg via ORAL
  Filled 2021-05-11: qty 1

## 2021-05-11 MED ORDER — SODIUM CHLORIDE 0.9 % IV BOLUS
1000.0000 mL | Freq: Once | INTRAVENOUS | Status: AC
  Administered 2021-05-11: 1000 mL via INTRAVENOUS

## 2021-05-11 MED ORDER — METOCLOPRAMIDE HCL 5 MG/ML IJ SOLN
10.0000 mg | Freq: Three times a day (TID) | INTRAMUSCULAR | Status: DC
  Administered 2021-05-11 – 2021-05-15 (×11): 10 mg via INTRAVENOUS
  Filled 2021-05-11 (×11): qty 2

## 2021-05-11 NOTE — ED Provider Notes (Signed)
? ?Emergency Department Provider Note ? ? ?I have reviewed the triage vital signs and the nursing notes. ? ? ?HISTORY ? ?Chief Complaint ?Emesis and Abdominal Pain ? ? ?HPI ?Carrie Jensen is a 51 y.o. female with past medical history noted below presents to the emergency department for reevaluation of abdominal pain with vomiting.  Symptoms began after colonoscopy performed on 4/21 with [facility].  Patient has had diffuse abdominal pain along with nausea and vomiting since this procedure.  No fevers. ? ? ?Past Medical History:  ?Diagnosis Date  ? Anxiety   ? Cancer [facility])   ? CIN II (cervical intraepithelial neoplasia II) 1998  ? LEEP  ? Depression   ? Endometriosis   ? GERD (gastroesophageal reflux disease)   ? Hyperthyroidism   ? DR. BALAN  ? IBS (irritable bowel syndrome)   ? Migraines   ? Plantar fasciitis of left foot   ? ? ?Review of Systems ? ?Constitutional: No fever/chills ?Eyes: No visual changes. ?ENT: No sore throat. ?Cardiovascular: Denies chest pain. ?Respiratory: Denies shortness of breath. ?Gastrointestinal: Positive abdominal pain. Positive nausea and vomiting.  No diarrhea.  No constipation. ?Genitourinary: Negative for dysuria. ?Musculoskeletal: Negative for back pain. ?Skin: Negative for rash. ?Neurological: Negative for headaches. ? ? ?____________________________________________ ? ? ?PHYSICAL EXAM: ? ?VITAL SIGNS: ?ED Triage Vitals  ?Enc Vitals Group  ?   BP 05/11/21 0724 129/84  ?   Pulse Rate 05/11/21 0724 71  ?   Resp 05/11/21 0724 16  ?   Temp 05/11/21 0724 (!) 97.4 ?F (36.3 ?C)  ?   Temp Source 05/11/21 0724 Oral  ?   SpO2 05/11/21 0724 100 %  ?   Weight 05/11/21 0726 155 lb (70.3 kg)  ?   Height 05/11/21 0726 '5\' 1"'$  (1.549 m)  ? ?Constitutional: Alert and oriented. Well appearing and in no acute distress. ?Eyes: Conjunctivae are normal.  ?Head: Atraumatic. ?Nose: No congestion/rhinnorhea. ?Mouth/Throat: Mucous membranes are moist.   ?Neck: No stridor.   ?Cardiovascular: Normal rate,  regular rhythm. Good peripheral circulation. Grossly normal heart sounds.   ?Respiratory: Normal respiratory effort.  No retractions. Lungs CTAB. ?Gastrointestinal: Soft with mild diffuse tenderness. No distention.  ?Musculoskeletal: No gross deformities of extremities. ?Neurologic:  Normal speech and language.  ?Skin:  Skin is warm, dry and intact. No rash noted. ? ? ?____________________________________________ ?  ?LABS ?(all labs ordered are listed, but only abnormal results are displayed) ? ?Labs Reviewed  ?COMPREHENSIVE METABOLIC PANEL - Abnormal; Notable for the following components:  ?    Result Value  ? Potassium 3.3 (*)   ? CO2 19 (*)   ? Total Bilirubin 1.8 (*)   ? All other components within normal limits  ?CBC - Abnormal; Notable for the following components:  ? Hemoglobin 15.7 (*)   ? RDW 11.2 (*)   ? All other components within normal limits  ?URINALYSIS, ROUTINE W REFLEX MICROSCOPIC - Abnormal; Notable for the following components:  ? Color, Urine AMBER (*)   ? APPearance HAZY (*)   ? Hgb urine dipstick SMALL (*)   ? Ketones, ur 80 (*)   ? Protein, ur 30 (*)   ? Bacteria, UA RARE (*)   ? All other components within normal limits  ?BILIRUBIN, FRACTIONATED(TOT/DIR/INDIR) - Abnormal; Notable for the following components:  ? Total Bilirubin 2.1 (*)   ? Bilirubin, Direct 0.7 (*)   ? Indirect Bilirubin 1.4 (*)   ? All other components within normal limits  ?LIPASE, BLOOD  ? ?  ____________________________________________ ? ?RADIOLOGY ? ?DG Abdomen Acute W/Chest ? ?Result Date: 05/11/2021 ?CLINICAL DATA:  Abdominal pain EXAM: DG ABDOMEN ACUTE WITH 1 VIEW CHEST COMPARISON:  None. FINDINGS: Cardiac size is within normal limits. Lung fields are clear of any infiltrates or pulmonary edema. There is no pleural effusion or pneumothorax. Bowel gas pattern is nonspecific. Small amount of stool is seen in colon without signs of fecal impaction in the rectum. There is no pneumoperitoneum. No abnormal masses or  calcifications. Kidneys are partly obscured by bowel contents. Degenerative changes are noted in lower lumbar spine. IMPRESSION: There is no evidence of intestinal obstruction or pneumoperitoneum. No abnormal masses or calcifications are seen. No active disease is seen in the chest. Electronically Signed   By: Elmer Picker M.D.   On: 05/11/2021 10:55   ? ?____________________________________________ ? ? ?PROCEDURES ? ?Procedure(s) performed:  ? ?Procedures ? ?None  ?____________________________________________ ? ? ?INITIAL IMPRESSION / ASSESSMENT AND PLAN / ED COURSE ? ?Pertinent labs & imaging results that were available during my care of the patient were reviewed by me and considered in my medical decision making (see chart for details). ?  ?This patient is Presenting for Evaluation of abdominal pain, which does require a range of treatment options, and is a complaint that involves a high risk of morbidity and mortality. ? ?The Differential Diagnoses Differential diagnosis includes but is not exclusive to acute cholecystitis, intrathoracic causes for epigastric abdominal pain, gastritis, duodenitis, pancreatitis, small bowel or large bowel obstruction, abdominal aortic aneurysm, hernia, gastritis, etc. ? ? ?Critical Interventions-  ?  ?Medications  ?lactated ringers infusion (has no administration in time range)  ?dexamethasone (DECADRON) injection 4 mg (has no administration in time range)  ?potassium chloride 10 mEq in 100 mL IVPB (has no administration in time range)  ?ondansetron (ZOFRAN-ODT) disintegrating tablet 4 mg (4 mg Oral Given 05/11/21 0734)  ?sodium chloride 0.9 % bolus 1,000 mL (0 mLs Intravenous Stopped 05/11/21 1437)  ?pantoprazole (PROTONIX) 80 mg /NS 100 mL IVPB (0 mg Intravenous Stopped 05/11/21 1214)  ?morphine (PF) 4 MG/ML injection 4 mg (4 mg Intravenous Given 05/11/21 1105)  ?ondansetron (ZOFRAN) injection 4 mg (4 mg Intravenous Given 05/11/21 1105)  ?morphine (PF) 4 MG/ML injection 4 mg  (4 mg Intravenous Given 05/11/21 1250)  ?dicyclomine (BENTYL) injection 20 mg (20 mg Intramuscular Given 05/11/21 1250)  ?LORazepam (ATIVAN) injection 1 mg (1 mg Intravenous Given 05/11/21 1436)  ? ? ?Reassessment after intervention:  Symptoms continue.  ? ? ?I did obtain Additional Historical Information from husband at bedside. ? ?I decided to review pertinent External Data, and in summary multiple prior ED/UC visits and telephone encounters with GI. ?  ?Clinical Laboratory Tests Ordered, included UA without evidence of infection.  Ketones likely related to dehydration.  Fractionated bilirubin showed total bili of 2.0.  Potassium slightly low at 3.3.  No acute kidney injury.  Lipase normal.  No leukocytosis. ? ?Radiologic Tests Ordered, included acute abdomen plain films. I independently interpreted the images and agree with radiology interpretation.  ? ?Cardiac Monitor Tracing which shows NSR. ? ? ?Social Determinants of Health Risk no smoking history.  ? ?Consult complete with [facility]. Appreciate consultation and recommendations.  ? ?Hospitalist - plan for admit.  ? ?Medical Decision Making: Summary:  ?Patient presents to the emergency department for evaluation of intractable abdominal pain with nausea and vomiting.  Does have history of a IBS.  Question flare related to this.  Versus viral infection.  Patient had CT imaging within  the past several days.  Did not repeat that here but did obtain acute abdomen series which does not show free air or obvious obstruction.  Patient given multiple medications for pain and nausea control along with GI consultation as above.  After Ativan, patient continues to complain of abdominal pain and severe nausea.  Plan for admit for IV fluids, Decadron per GI recommendation, observation.  ? ?Reevaluation with update and discussion with patient. Symptoms continue. Does not feel well enough to return home. Plan for admit.  ? ?Disposition:  admit ? ?____________________________________________ ? ?FINAL CLINICAL IMPRESSION(S) / ED DIAGNOSES ? ?Final diagnoses:  ?Generalized abdominal pain  ?Intractable nausea and vomiting  ? ? ? ?Note:  This document was prepared using Set designer

## 2021-05-11 NOTE — ED Notes (Signed)
Unable to collect urine sample patient stated"she is unable to go at this time."  ?

## 2021-05-11 NOTE — H&P (Signed)
?History and Physical  ? ? ?Patient: Carrie Jensen FBP:[id number] DOB: 1970/10/02 ?DOA: 05/11/2021 ?DOS: the patient was seen and examined on 05/11/2021 ?PCP: Pcp, No  ?Patient coming from: Home ? ?Chief Complaint:  ?Chief Complaint  ?Patient presents with  ? Emesis  ? Abdominal Pain  ? ?HPI: Carrie Jensen is a 51 y.o. female with medical history significant of anxiety, depression, cervical cancer, endometriosis, GERD hypothyroidism, IVH, migraine headaches, left foot plantar fasciitis who underwent a colonoscopy 4 days ago with postprocedural development of abdominal pain, nausea with over 20 episodes of emesis since symptoms began.  She has had soft stools and her stools have looked dark, but no hematochezia.  No flank pain, dysuria, frequency or hematuria.  She denied fever, chills, dyspnea, productive cough, chest pain, palpitations, PND, orthopnea or pitting edema lower extremities. ? ?ED course: Initial vital signs were temperature 97.4 ?F, pulse 71, respirations 16, BP 129/84 mmHg O2 sat 100% on room air.  The patient received IV fluids, PPI, antiemetics and analgesics in the emergency department. ? ?Lab work: Her urinalysis shows small hemoglobinuria with ketonuria of 80 and proteinuria of 30 mg/dL rare bacteria microscopic examination.  CBC with a count of 5.5, hemoglobin 15.7 g/dL platelets 342.  CMP with potassium 3.3 and CO2 of 19 mmol/L.  Her bilirubin was 1.8 mg/dL.  The rest of the CMP measurements were unremarkable.  Lipase level was normal. ? ?Imaging: Imaging showing cholelithiasis without cholecystitis, uterine fibroid and possible liver hemangioma.  Please see images and full radiology report for further details. ?  ?Review of Systems: As mentioned in the history of present illness. All other systems reviewed and are negative. ?Past Medical History:  ?Diagnosis Date  ? Anxiety   ? Cancer [facility])   ? CIN II (cervical intraepithelial neoplasia II) 1998  ? LEEP  ? Depression   ?  Endometriosis   ? GERD (gastroesophageal reflux disease)   ? Hyperthyroidism   ? DR. BALAN  ? IBS (irritable bowel syndrome)   ? Migraines   ? Plantar fasciitis of left foot   ? ?Past Surgical History:  ?Procedure Laterality Date  ? CERVICAL BIOPSY  W/ LOOP ELECTRODE EXCISION  01/18/1996  ? CIN II  ? COLONOSCOPY    ? MOHS SURGERY    ? OVARIAN CYSTECTOMY, PARTIAL LSO, FULGURATION OF ENDOMETRIOSIS    ? ?Social History:  reports that she has never smoked. She has never used smokeless tobacco. She reports that she does not drink alcohol and does not use drugs. ? ?Allergies  ?Allergen Reactions  ? Erythromycin Nausea And Vomiting  ? Other Nausea And Vomiting and Other (See Comments)  ?  Possible reaction to Anesthesia (afterwards) = "Cold/hot feeling, cannot keep down food, N/V, and stomach pain"  ? ? ?Family History  ?Problem Relation Age of Onset  ? Colon polyps Brother   ? Cancer Maternal Aunt   ?     OVARIAN  ? Heart disease Maternal Grandmother   ? Stroke Maternal Grandmother   ? Diabetes Maternal Grandfather   ? Hypertension Maternal Grandfather   ? Heart disease Maternal Grandfather   ? Heart disease Paternal Grandmother   ? Heart disease Paternal Grandfather   ? Breast cancer Neg Hx   ? Colon cancer Neg Hx   ? Esophageal cancer Neg Hx   ? Stomach cancer Neg Hx   ? Rectal cancer Neg Hx   ? ? ?Prior to Admission medications   ?Medication Sig Start Date End Date Taking?  Authorizing Provider  ?ALPRAZolam (XANAX) 0.25 MG tablet TAKE ONE TABLET BY MOUTH EVERY NIGHT AT BEDTIME AS NEEDED FOR ANXIETY OR SLEEP ?Patient taking differently: Take 0.25 mg by mouth daily as needed for anxiety. 10/26/20  Yes Tamela Gammon, NP  ?CALCIUM PO Take 2 tablets by mouth at bedtime.   Yes [provider]  ?Carboxymethylcell-Hypromellose (GENTEAL OP) Place 1 drop into both eyes every morning.   Yes [provider]  ?Cholecalciferol (VITAMIN D3 PO) Take 1 capsule by mouth at bedtime.   Yes [provider]   ?Cyanocobalamin (VITAMIN B-12 PO) Take 1 tablet by mouth every morning.   Yes [provider]  ?ibuprofen (ADVIL) 200 MG tablet Take 400 mg by mouth daily as needed (minor discomfort).   Yes [provider]  ?Melatonin 10 MG TABS Take 10 mg by mouth at bedtime.   Yes [provider]  ?Multiple Vitamins-Minerals (ZINC PO) Take 1 tablet by mouth at bedtime.   Yes [provider]  ?Omega-3 Fatty Acids (FISH OIL PO) Take 1 capsule by mouth at bedtime.   Yes [provider]  ?omeprazole (PRILOSEC OTC) 20 MG tablet Take 20 mg by mouth at bedtime.   Yes [provider]  ?ondansetron (ZOFRAN-ODT) 4 MG disintegrating tablet Take 1 tablet (4 mg total) by mouth every 8 (eight) hours as needed for nausea or vomiting. ?Patient taking differently: Take 4 mg by mouth every 6 (six) hours as needed for nausea or vomiting (dissolve orally). 05/09/21  Yes Fredia Sorrow, MD  ?[facility] [place]) 3 MG TABS Take 3 mg by mouth every morning. 03/08/21  Yes Tamela Gammon, NP  ?Probiotic Product (PROBIOTIC-10 PO) Take 1 tablet by mouth at bedtime.   Yes [provider]  ?SUMAtriptan (IMITREX) 50 MG tablet Take 1 tablet (50 mg total) by mouth every 2 (two) hours as needed for migraine. May repeat in 2 hours if headache persists or recurs. ?Patient taking differently: Take 50 mg by mouth See admin instructions. Take one tablet (50 mg) by mouth at onset of migraine headache and may repeat in 2 hours if headache persists or recurs 03/08/21  Yes Juleen China, Tiffany A, NP  ?venlafaxine XR (EFFEXOR-XR) 150 MG 24 hr capsule TAKE ONE CAPSULE BY MOUTH ONCE DAILY WITH BREAKFAST ?Patient taking differently: 150 mg daily with breakfast. 03/08/21  Yes Marny Lowenstein A, NP  ?vitamin C (ASCORBIC ACID) 500 MG tablet Take 500 mg by mouth at bedtime.   Yes [provider]  ?norethindrone-ethinyl estradiol (JUNEL 1/20) 1-20 MG-MCG tablet TAKE ONE TABLET BY MOUTH DAILY  CONTINUOUSLY ?Patient not taking: Reported on 04/16/2021 03/18/20   Tamela Gammon, NP  ?promethazine (PHENERGAN) 25 MG tablet Take 1 tablet (25 mg total) by mouth every 6 (six) hours as needed for nausea. ?Patient not taking: Reported on 05/11/2021 03/08/21   Tamela Gammon, NP  ? ? ?Physical Exam: ?Vitals:  ? 05/11/21 1445 05/11/21 1500 05/11/21 1515 05/11/21 1530  ?BP: (!) 147/94 139/79 (!) 140/93 136/90  ?Pulse: 78 83 82 73  ?Resp: '12 16 14 '$ (!) 9  ?Temp:      ?TempSrc:      ?SpO2: 100% 100% 99% 100%  ?Weight:      ?Height:      ? ?Physical Exam ?Vitals and nursing note reviewed.  ?Constitutional:   ?   Appearance: She is well-developed.  ?HENT:  ?   Head: Normocephalic.  ?   Mouth/Throat:  ?   Mouth: Mucous membranes are  dry.  ?Eyes:  ?   General: No scleral icterus. ?   Pupils: Pupils are equal, round, and reactive to light.  ?Neck:  ?   Vascular: No JVD.  ?Cardiovascular:  ?   Rate and Rhythm: Normal rate and regular rhythm.  ?   Heart sounds: S1 normal and S2 normal.  ?Pulmonary:  ?   Effort: Pulmonary effort is normal.  ?   Breath sounds: Normal breath sounds. No wheezing, rhonchi or rales.  ?Abdominal:  ?   General: Bowel sounds are normal. There is no distension.  ?   Palpations: Abdomen is soft.  ?   Tenderness: There is abdominal tenderness in the epigastric area. There is no right CVA tenderness, left CVA tenderness or guarding.  ?Musculoskeletal:  ?   Cervical back: Neck supple.  ?   Right lower leg: No edema.  ?   Left lower leg: No edema.  ?Skin: ?   General: Skin is warm and dry.  ?Neurological:  ?   General: No focal deficit present.  ?   Mental Status: She is alert and oriented to person, place, and time.  ?Psychiatric:     ?   Mood and Affect: Mood normal.     ?   Behavior: Behavior normal.  ? ? ?Data Reviewed: ? ?There are no new results to review at this time. ? ?Assessment and Plan: ?Principal Problem: ?  Intractable abdominal pain ?Observation/telemetry. ?Keep NPO. ?Continue IV  fluids. ?Analgesics as needed. ?Antiemetics as needed. ?Protonix 40 mg IV every 12 hours. ?Gastroenterology is following. ? ?Active Problems: ? Hyperbilirubinemia ?No previous instances. ?Imaging showing: ?  Cholelithiasis without cholecys

## 2021-05-11 NOTE — ED Notes (Signed)
Patient has a urine culture in the main lab 

## 2021-05-11 NOTE — ED Notes (Addendum)
Pt given ice chips

## 2021-05-11 NOTE — Progress Notes (Signed)
Patient will be given dose of IV Ativan and trial of clear liquids. If does okay then hopefully home. Can try phenergan suppositories at home. I may her an office follow up for 05/27/21 at 10 am. I did tell her that if symptoms resolve she may not need to keep appt but asked that she call to cancel if not needed.  ?

## 2021-05-11 NOTE — Consult Note (Addendum)
? ?                                            Consultation Note ? ? ?Referring Provider: [facility] ?PCP: Pcp, No ?Primary Gastroenterologist: Dr. Lorenso Courier ?Reason for consultation: Nausea, vomiting , abdominal pain   ?Hospital Day: 1 ? ? Attending physician's note  ?I have taken a history, reviewed the chart and examined the patient. I performed a substantive portion of this encounter, including complete performance of at least one of the key components, in conjunction with the APP. I agree with the APP's note, impression and recommendations.  ?  ?Postprocedure severe nausea and vomiting.  She has not been able to take any p.o. intake in the past 5 days since colonoscopy. ? ??  Diarrhea on further questioning patient reports passing small-volume liquid stool, she has not been taking any p.o. intake and she had a complete bowel prep for the colonoscopy ? ?Reviewed labs and imaging, unrevealing for any significant pathology other than evidence of volume depletion/hemoconcentrated due to poor p.o. intake ? ?She has Cholelithiasis with no evidence of acute cholecystitis ? ?Possible post op nausea and vomiting ?IV Ativan 2 mg X1 ? ?We will do a trial of p.o. full liquids, if tolerating can discharge home on Phenergan 12.5 mg twice daily as needed or phenergan suppositories '25mg'$  prn and advance diet as tolerated ? ?If continues to have persistent symptoms, consider IV Decadron 4 mg X1 ? ?We will arrange for GI office follow-up soon ? ?The patient was provided an opportunity to ask questions and all were answered. The patient agreed with the plan and demonstrated an understanding of the instructions. ? ?K. Denzil Magnuson , MD ?[phone]    ? ?ASSESSMENT:  ?Carrie Jensen is a 51 y.o. female known to Dr. Lorenso Courier with a past medical history significant for cholelithiasis, IBS with constipation, anxiety, depression, migraines, hyperthyroidism  See PMH for any additional medical problems. ? ?Nausea, vomiting,  periumbilical pain, black stool ( last episode yesterday) ?Symptoms started following screening colonoscopy 05/07/21 ( normal exam). Unclear etiology. CT scan unrevealing. KUB normal. Labs unremarkable.  ?BUN normal. Hgb stable at 15.7.  ? ?Volume depletion secondary to above ? ?Elevated total bilirubin of 2.1,  probably Gilbert's syndrome ?Predominantly indirect ? ?IBS with constipation.  ?Doing well on Trulance ? ?Cholelithiasis.  ?Seems asymptomatic at this point ? ?Liver lesions on CT scan.  ?There is a 8.2 x 7.2 cm simple cyst in the right lobe liver. Smaller subcentimeter cysts are identified in the liver. In the inferior anterior liver, there is a 2.8 x 1.5 cm enhancing mass which become isodense delayed images, favor hemangioma ? ?Uterine mass on CT scan, 5.8 cm and possibly a fibroid.  ?Last GYN visit in epic was Feb 2022 and without mention of uterine fibroids.  ? ? ? ?PLAN:  ?Volume repletion and potassium repletion in progress ?Anti-emetics ?Dr. Silverio Decamp will see her soon. Despite normal workup I don't know that she will be able to manage symptoms at home.   ?She will need GYN follow up as outpatient ?Possible outpatient MRI to further evaluate liver lesions on CT ? ? ?History of Present Illness:  ?Had normal screening colonoscopy on 4/21. Patient has since been having nausea / vomiting and intermittent periumbilical pain. She has been having small black BMs but emesis is clear to yellowish in color.  No bismuth use. No NSAID use. She has been unable to hold down fluids. She has intermittent chills. She has been seen in UC and then came to ED today for ongoing symptoms. CT scan 4/23 showed cholelithiasis, a liver cyst, possible a liver hemangioma and also a uterine mass question fibroid. . A plain abdominal film today was unremarkable.  Her K+ is low, Tbili 1.8,  hematocrit is elevated. Labs otherwise unremarkable.  ED treating for volume depletion.  ? ?Previous GI Evaluation / History   ? ?05/07/21  Screening colonoscopy ?-The examined portion of the ileum was normal. ?- Non-bleeding internal hemorrhoids. ?- No specimens collected. ? ?Recent Labs and Imaging ?CT Abdomen Pelvis W Contrast ? ?Result Date: 05/09/2021 ?CLINICAL DATA:  Status post colonoscopy on Friday with vomiting, diarrhea and nausea. EXAM: CT ABDOMEN AND PELVIS WITH CONTRAST TECHNIQUE: Multidetector CT imaging of the abdomen and pelvis was performed using the standard protocol following bolus administration of intravenous contrast. RADIATION DOSE REDUCTION: This exam was performed according to the departmental dose-optimization program which includes automated exposure control, adjustment of the mA and/or kV according to patient size and/or use of iterative reconstruction technique. CONTRAST:  135m OMNIPAQUE IOHEXOL 300 MG/ML  SOLN COMPARISON:  None. FINDINGS: Lower chest: No acute abnormality. Hepatobiliary: There is a 8.2 x 7.2 cm simple cyst in the right lobe liver. Smaller subcentimeter cysts are identified in the liver. In the inferior anterior liver, there is a 2.8 x 1.5 cm enhancing mass which become isodense delayed images, favor hemangioma. Gallstones are noted in the gallbladder. The gallbladder is distended. There is no inflammation around the gallbladder. The biliary tree is normal. Pancreas: Unremarkable. No pancreatic ductal dilatation or surrounding inflammatory changes. Spleen: Normal in size without focal abnormality. Adrenals/Urinary Tract: Adrenal glands are unremarkable. Kidneys are normal, without renal calculi, focal lesion, or hydronephrosis. Bladder is unremarkable. Stomach/Bowel: Stomach is within normal limits. Appendix appears normal. No evidence of bowel wall thickening, distention, or inflammatory changes. Vascular/Lymphatic: Aortic atherosclerosis. No enlarged abdominal or pelvic lymph nodes. Reproductive: There is a 5.8 x 4.3 cm mass extending posteriorly from the uterus question uterine fibroid. The bilateral  adnexa are normal. Other: None.  No free air. Musculoskeletal: Degenerative joint changes of L5-S1 is noted. IMPRESSION: 1. No acute abnormality identified in the abdomen or pelvis. No free air. 2. Cholelithiasis without evidence of acute cholecystitis. 3. 8.2 cm simple cyst in the right lobe liver. 4. 2.8 cm enhancing mass in the inferior anterior liver, favor hemangioma. 5. 5.8 cm mass extending posteriorly from the uterus question uterine fibroid. Aortic Atherosclerosis (ICD10-I70.0). Electronically Signed   By: WAbelardo DieselM.D.   On: 05/09/2021 12:50  ? ?DG Abdomen Acute W/Chest ? ?Result Date: 05/11/2021 ?CLINICAL DATA:  Abdominal pain EXAM: DG ABDOMEN ACUTE WITH 1 VIEW CHEST COMPARISON:  None. FINDINGS: Cardiac size is within normal limits. Lung fields are clear of any infiltrates or pulmonary edema. There is no pleural effusion or pneumothorax. Bowel gas pattern is nonspecific. Small amount of stool is seen in colon without signs of fecal impaction in the rectum. There is no pneumoperitoneum. No abnormal masses or calcifications. Kidneys are partly obscured by bowel contents. Degenerative changes are noted in lower lumbar spine. IMPRESSION: There is no evidence of intestinal obstruction or pneumoperitoneum. No abnormal masses or calcifications are seen. No active disease is seen in the chest. Electronically Signed   By: PElmer PickerM.D.   On: 05/11/2021 10:55   ? ?Labs:  ?Recent Labs  ?  05/09/21 ?1020 05/11/21 ?0745  ?WBC 7.5 5.5  ?HGB 16.4* 15.7*  ?HCT 46.3* 43.7  ?PLT 322 342  ? ?Recent Labs  ?  05/09/21 ?1020 05/11/21 ?0745  ?NA 138 138  ?K 3.8 3.3*  ?CL 105 106  ?CO2 25 19*  ?GLUCOSE 110* 97  ?BUN 15 19  ?CREATININE 0.68 0.56  ?CALCIUM 9.5 9.5  ? ?Recent Labs  ?  05/11/21 ?0745  ?PROT 7.2  ?ALBUMIN 4.0  ?AST 28  ?ALT 28  ?ALKPHOS 68  ?BILITOT 1.8*  ? ?No results for input(s): HEPBSAG, HCVAB, HEPAIGM, HEPBIGM in the last 72 hours. ?No results for input(s): LABPROT, INR in the last 72  hours. ? ?HCG negative ? ?Past Medical History:  ?Diagnosis Date  ? Anxiety   ? Cancer [facility])   ? CIN II (cervical intraepithelial neoplasia II) 1998  ? LEEP  ? Depression   ? Endometriosis   ? GERD (gastroesophageal reflux

## 2021-05-11 NOTE — ED Notes (Signed)
GI at bedside w/ pt and husband. ?

## 2021-05-11 NOTE — Telephone Encounter (Signed)
Attempted to reach patient for post-procedure f/u call. No answer. Left message. However, upon opening her chart to make this call I see she is currently at [place] with c/o N/V/D and was also seen there on 05/09/21 with the same complaint. According to ER notes, looks like symptoms were attributed to a gastroenteritis versus post-colonoscopy complications. Routing this note to Dr. Lorenso Courier so she is aware of above. ?

## 2021-05-11 NOTE — ED Triage Notes (Addendum)
Patient reports that she had a colonoscopy 4 days  ago. Patient reports that she continues to have vomiting and abdominal pain. ?Patient was seen 2 days ago in the ED. ?

## 2021-05-12 DIAGNOSIS — K8020 Calculus of gallbladder without cholecystitis without obstruction: Secondary | ICD-10-CM

## 2021-05-12 DIAGNOSIS — R112 Nausea with vomiting, unspecified: Secondary | ICD-10-CM | POA: Diagnosis not present

## 2021-05-12 DIAGNOSIS — K219 Gastro-esophageal reflux disease without esophagitis: Secondary | ICD-10-CM | POA: Diagnosis present

## 2021-05-12 DIAGNOSIS — Z809 Family history of malignant neoplasm, unspecified: Secondary | ICD-10-CM | POA: Diagnosis not present

## 2021-05-12 DIAGNOSIS — R109 Unspecified abdominal pain: Secondary | ICD-10-CM | POA: Diagnosis not present

## 2021-05-12 DIAGNOSIS — K8000 Calculus of gallbladder with acute cholecystitis without obstruction: Secondary | ICD-10-CM | POA: Diagnosis present

## 2021-05-12 DIAGNOSIS — R1011 Right upper quadrant pain: Secondary | ICD-10-CM | POA: Diagnosis not present

## 2021-05-12 DIAGNOSIS — Z79899 Other long term (current) drug therapy: Secondary | ICD-10-CM | POA: Diagnosis not present

## 2021-05-12 DIAGNOSIS — G43909 Migraine, unspecified, not intractable, without status migrainosus: Secondary | ICD-10-CM | POA: Diagnosis present

## 2021-05-12 DIAGNOSIS — E876 Hypokalemia: Secondary | ICD-10-CM | POA: Diagnosis present

## 2021-05-12 DIAGNOSIS — Z888 Allergy status to other drugs, medicaments and biological substances status: Secondary | ICD-10-CM | POA: Diagnosis not present

## 2021-05-12 DIAGNOSIS — F32A Depression, unspecified: Secondary | ICD-10-CM | POA: Diagnosis present

## 2021-05-12 DIAGNOSIS — E869 Volume depletion, unspecified: Secondary | ICD-10-CM | POA: Diagnosis present

## 2021-05-12 DIAGNOSIS — Z8541 Personal history of malignant neoplasm of cervix uteri: Secondary | ICD-10-CM | POA: Diagnosis not present

## 2021-05-12 DIAGNOSIS — E0590 Thyrotoxicosis, unspecified without thyrotoxic crisis or storm: Secondary | ICD-10-CM | POA: Diagnosis present

## 2021-05-12 DIAGNOSIS — E039 Hypothyroidism, unspecified: Secondary | ICD-10-CM | POA: Diagnosis present

## 2021-05-12 DIAGNOSIS — F419 Anxiety disorder, unspecified: Secondary | ICD-10-CM | POA: Diagnosis present

## 2021-05-12 DIAGNOSIS — Z881 Allergy status to other antibiotic agents status: Secondary | ICD-10-CM | POA: Diagnosis not present

## 2021-05-12 DIAGNOSIS — D259 Leiomyoma of uterus, unspecified: Secondary | ICD-10-CM | POA: Diagnosis present

## 2021-05-12 DIAGNOSIS — K581 Irritable bowel syndrome with constipation: Secondary | ICD-10-CM | POA: Diagnosis present

## 2021-05-12 DIAGNOSIS — R1084 Generalized abdominal pain: Secondary | ICD-10-CM | POA: Diagnosis present

## 2021-05-12 LAB — COMPREHENSIVE METABOLIC PANEL
ALT: 22 U/L (ref 0–44)
AST: 24 U/L (ref 15–41)
Albumin: 3.9 g/dL (ref 3.5–5.0)
Alkaline Phosphatase: 71 U/L (ref 38–126)
Anion gap: 10 (ref 5–15)
BUN: 14 mg/dL (ref 6–20)
CO2: 22 mmol/L (ref 22–32)
Calcium: 9.7 mg/dL (ref 8.9–10.3)
Chloride: 104 mmol/L (ref 98–111)
Creatinine, Ser: 0.5 mg/dL (ref 0.44–1.00)
GFR, Estimated: >60 mL/min (ref >60)
Glucose, Bld: 103 mg/dL — ABNORMAL HIGH (ref 70–99)
Potassium: 3.7 mmol/L (ref 3.5–5.1)
Sodium: 136 mmol/L (ref 135–145)
Total Bilirubin: 1.6 mg/dL — ABNORMAL HIGH (ref 0.3–1.2)
Total Protein: 7.1 g/dL (ref 6.5–8.1)

## 2021-05-12 LAB — CBC
HCT: 41.7 % (ref 36.0–46.0)
Hemoglobin: 15.1 g/dL — ABNORMAL HIGH (ref 12.0–15.0)
MCH: 33.4 pg (ref 26.0–34.0)
MCHC: 36.2 g/dL — ABNORMAL HIGH (ref 30.0–36.0)
MCV: 92.3 fL (ref 80.0–100.0)
Platelets: 312 10*3/uL (ref 150–400)
RBC: 4.52 MIL/uL (ref 3.87–5.11)
RDW: 11 % — ABNORMAL LOW (ref 11.5–15.5)
WBC: 7.3 10*3/uL (ref 4.0–10.5)
nRBC: 0 % (ref 0.0–0.2)

## 2021-05-12 LAB — HIV ANTIBODY (ROUTINE TESTING W REFLEX): HIV Screen 4th Generation wRfx: NONREACTIVE

## 2021-05-12 MED ORDER — SODIUM CHLORIDE 0.9 % IV SOLN
12.5000 mg | Freq: Four times a day (QID) | INTRAVENOUS | Status: DC | PRN
  Administered 2021-05-12 (×2): 12.5 mg via INTRAVENOUS
  Filled 2021-05-12: qty 12.5
  Filled 2021-05-12: qty 0.5
  Filled 2021-05-12: qty 12.5

## 2021-05-12 NOTE — Progress Notes (Addendum)
? ? ? ?Daily Progress Note ? ?Hospital Day: 2 ? ?Chief Complaint: nausea and vomiting ? ? Attending physician's note  ? ?I have taken a history, reviewed the chart and examined the patient. I performed a substantive portion of this encounter, including complete performance of at least one of the key components, in conjunction with the APP. I agree with the APP's note, impression and recommendations.  ?  ?Unclear etiology of intractable nausea and vomiting s/p colonoscopy ?Work-up so far is unrevealing ?No improvement of symptoms with IV Ativan or Decadron.  She is receiving antiemetics with no significant change in her symptoms ? ?She has mild elevation in both direct and indirect bilirubin and also has history of Coley lithiasis.  Will obtain HIDA scan to exclude gallbladder dysfunction or cholecystitis as etiology for severe nausea and vomiting ? ?If HIDA scan unrevealing, will plan to reimage with CT abdomen and pelvis to exclude possible partial splenic laceration causing abdominal pain and nausea this seems less likely given hemoglobin is stable ? ?Continue supportive care ?Diet as tolerated ? ? ? The patient was provided an opportunity to ask questions and all were answered. The patient agreed with the plan and demonstrated an understanding of the instructions. ? ? ?K. Denzil Magnuson , MD ?[phone]    ? ?Brief History ?Mackenzye Mackel is a 51 y.o. female with a pmh not limited to cholelithiasis, IBS with constipation, anxiety, depression, migraines, hyperthyroidism. She underwent screening colonoscopy on 4/21 which was normal. Since colonoscopy she has been having nausea, vomiting, periumbilical pain and some small pieces of formed black stool.  ? ? ?ASSESSMENT: ? ?Post procedure nausea, vomiting and mid abdominal pain.  ?CT scan, KUB and labs unrevealing.  Getting scheduled Reglan, also getting Zofran. Requiring Dilaudid for pain.  ? ?Volume depletion ?Treated with liter bolus of fluid. Getting LR  at 125 ml / hr ? ?IBS with constipation ?Takes Trulance at home ? ?Cholelithiasis ?No evidence for acute cholecystitis.  ? ?Probable Gilbert's syndrome ? ?Liver lesions on CT scan.  ?There is a 8.2 x 7.2 cm simple cyst in the right lobe liver. Smaller subcentimeter cysts are identified in the liver. In the inferior anterior liver, there is a 2.8 x 1.5 cm enhancing mass which become isodense delayed images, favor hemangioma ?  ?Uterine mass on CT scan, 5.8 cm and possibly a fibroid.  ?Last GYN visit in epic was Feb 2023 and without mention of uterine fibroids. Patient wasn't aware of the fibroid ? ? ?PLAN:  ? ?She doesn't think she can manage symptoms at home. ?Continue IV PPI, IV anti-emetics.  ? ? ?Subjective: ?Unable to tolerate breakfast. Half way through breakfast she vomited. Still having intermittent mid abdominal pain. No BMs yesterday nor today  ? ?Objective ?Imaging:  ?CT Abdomen Pelvis W Contrast ? ?Result Date: 05/09/2021 ?CLINICAL DATA:  Status post colonoscopy on Friday with vomiting, diarrhea and nausea. EXAM: CT ABDOMEN AND PELVIS WITH CONTRAST TECHNIQUE: Multidetector CT imaging of the abdomen and pelvis was performed using the standard protocol following bolus administration of intravenous contrast. RADIATION DOSE REDUCTION: This exam was performed according to the departmental dose-optimization program which includes automated exposure control, adjustment of the mA and/or kV according to patient size and/or use of iterative reconstruction technique. CONTRAST:  171m OMNIPAQUE IOHEXOL 300 MG/ML  SOLN COMPARISON:  None. FINDINGS: Lower chest: No acute abnormality. Hepatobiliary: There is a 8.2 x 7.2 cm simple cyst in the right lobe liver. Smaller subcentimeter cysts are identified in the  liver. In the inferior anterior liver, there is a 2.8 x 1.5 cm enhancing mass which become isodense delayed images, favor hemangioma. Gallstones are noted in the gallbladder. The gallbladder is distended. There is no  inflammation around the gallbladder. The biliary tree is normal. Pancreas: Unremarkable. No pancreatic ductal dilatation or surrounding inflammatory changes. Spleen: Normal in size without focal abnormality. Adrenals/Urinary Tract: Adrenal glands are unremarkable. Kidneys are normal, without renal calculi, focal lesion, or hydronephrosis. Bladder is unremarkable. Stomach/Bowel: Stomach is within normal limits. Appendix appears normal. No evidence of bowel wall thickening, distention, or inflammatory changes. Vascular/Lymphatic: Aortic atherosclerosis. No enlarged abdominal or pelvic lymph nodes. Reproductive: There is a 5.8 x 4.3 cm mass extending posteriorly from the uterus question uterine fibroid. The bilateral adnexa are normal. Other: None.  No free air. Musculoskeletal: Degenerative joint changes of L5-S1 is noted. IMPRESSION: 1. No acute abnormality identified in the abdomen or pelvis. No free air. 2. Cholelithiasis without evidence of acute cholecystitis. 3. 8.2 cm simple cyst in the right lobe liver. 4. 2.8 cm enhancing mass in the inferior anterior liver, favor hemangioma. 5. 5.8 cm mass extending posteriorly from the uterus question uterine fibroid. Aortic Atherosclerosis (ICD10-I70.0). Electronically Signed   By: Abelardo Diesel M.D.   On: 05/09/2021 12:50  ? ?DG Abdomen Acute W/Chest ? ?Result Date: 05/11/2021 ?CLINICAL DATA:  Abdominal pain EXAM: DG ABDOMEN ACUTE WITH 1 VIEW CHEST COMPARISON:  None. FINDINGS: Cardiac size is within normal limits. Lung fields are clear of any infiltrates or pulmonary edema. There is no pleural effusion or pneumothorax. Bowel gas pattern is nonspecific. Small amount of stool is seen in colon without signs of fecal impaction in the rectum. There is no pneumoperitoneum. No abnormal masses or calcifications. Kidneys are partly obscured by bowel contents. Degenerative changes are noted in lower lumbar spine. IMPRESSION: There is no evidence of intestinal obstruction or  pneumoperitoneum. No abnormal masses or calcifications are seen. No active disease is seen in the chest. Electronically Signed   By: Elmer Picker M.D.   On: 05/11/2021 10:55   ? ?Lab Results: ?Recent Labs  ?  05/11/21 ?0745 05/12/21 ?5809  ?WBC 5.5 7.3  ?HGB 15.7* 15.1*  ?HCT 43.7 41.7  ?PLT 342 312  ? ?BMET ?Recent Labs  ?  05/11/21 ?0745 05/12/21 ?9833  ?NA 138 136  ?K 3.3* 3.7  ?CL 106 104  ?CO2 19* 22  ?GLUCOSE 97 103*  ?BUN 19 14  ?CREATININE 0.56 0.50  ?CALCIUM 9.5 9.7  ? ?LFT ?Recent Labs  ?  05/11/21 ?1223 05/12/21 ?8250  ?PROT  --  7.1  ?ALBUMIN  --  3.9  ?AST  --  24  ?ALT  --  22  ?ALKPHOS  --  71  ?BILITOT 2.1* 1.6*  ?BILIDIR 0.7*  --   ?IBILI 1.4*  --   ? ?PT/INR ?No results for input(s): LABPROT, INR in the last 72 hours. ? ? ?Scheduled inpatient medications:  ? metoCLOPramide (REGLAN) injection  10 mg Intravenous Q8H  ? pantoprazole (PROTONIX) IV  40 mg Intravenous Q24H  ? ?Continuous inpatient infusions:  ? lactated ringers 125 mL/hr at 05/12/21 0223  ? promethazine (PHENERGAN) injection (IM or IVPB)    ? ?PRN inpatient medications: acetaminophen **OR** acetaminophen, HYDROmorphone (DILAUDID) injection, ondansetron **OR** ondansetron (ZOFRAN) IV, promethazine (PHENERGAN) injection (IM or IVPB) ? ?Vital signs in last 24 hours: ?Temp:  [97.4 ?F (36.3 ?C)-97.8 ?F (36.6 ?C)] 97.4 ?F (36.3 ?C) (04/26 5397) ?Pulse Rate:  [67-86] 85 (04/26 0511) ?Resp:  [  9-23] 18 (04/26 0511) ?BP: (115-150)/(64-94) 123/78 (04/26 0511) ?SpO2:  [60 %-100 %] 100 % (04/26 0511) ?Weight:  [62 kg] 62 kg (04/25 1726) ?Last BM Date : 05/10/21 ? ?Intake/Output Summary (Last 24 hours) at 05/12/2021 1032 ?Last data filed at 05/12/2021 1000 ?Gross per 24 hour  ?Intake 1858.09 ml  ?Output 1400 ml  ?Net 458.09 ml  ? ? ? ?Physical Exam:  ?General: Alert female in NAD. Husband at bedside ?Heart:  Regular rate and rhythm. No lower extremity edema ?Pulmonary: Normal respiratory effort ?Abdomen: Soft, nondistended, nontender. Normal  bowel sounds.  ?Neurologic: Alert and oriented ?Psych: Pleasant. Cooperative.  ? ? ?Intake/Output from previous day: ?04/25 0701 - 04/26 0700 ?In: 1798.1 [P.O.:180; I.V.:543.5; IV Piggyback:1074.6] ?Out: 1400 [address] [place]

## 2021-05-12 NOTE — Progress Notes (Signed)
?PROGRESS NOTE ? ?Carrie Jensen  JYN:[id number] DOB: 23-Feb-1970 DOA: 05/11/2021 ?PCP: Pcp, No  ? ?Brief Narrative: ? ?Patient is a 51 year old female with history of anxiety, depression, cervical cancer, endometriosis, GERD, hypothyroidism, migraine headache who presented with abdominal pain, nausea, vomiting after colonoscopy.  She had undergone colonoscopy on 4/21 after that she developed the symptoms.  On presentation, she was hemodynamically stable.  Patient was admitted for the management of abdomen pain, nausea, vomiting.  Currently on IV fluids, antiemetics.  Her symptoms persist.  GI following ? ?Assessment & Plan: ? ?Principal Problem: ?  Intractable abdominal pain ?Active Problems: ?  Hyperthyroidism ?  Hyperbilirubinemia ?  Hypokalemia ?  Cholelithiasis ? ?Intractable abdominal pain/nausea/vomiting: Symptoms started after colonoscopy.  Still having abdominal discomfort, nausea, vomiting.  Continue current management with IV fluids, pain management, PPI.  GI following ? ?Hyperbilirubinemia: Mild.  Abdominal imaging showed cholelithiasis without evidence of cholecystitis. ? ?Uterine fibroid: We recommend outpatient follow-up with GYN. ? ? ?  ? ? ?  ?  ? ?DVT prophylaxis:SCDs Start: 05/11/21 1603 ? ? ?  Code Status: Full Code ? ?Family Communication: family present at bedside ? ?Patient status:Obs ? ?Patient is from :Home ? ?Anticipated discharge QM:VHQI ? ?Estimated DC date:tomorrow ? ? ?Consultants: GI ? ?Procedures:None ? ?Antimicrobials:  ?Anti-infectives (From admission, onward)  ? ? None  ? ?  ? ? ?Subjective: ?Patient seen and examined at the bedside this morning.  To my surprise, she was still having abdominal discomfort, nausea and vomiting.  Passing gas but no bowel movement.  Discharge planning canceled ? ?Objective: ?Vitals:  ? 05/11/21 1530 05/11/21 1726 05/11/21 2028 05/12/21 0511  ?BP: 136/90 (!) 148/85 127/78 123/78  ?Pulse: 73 81 74 85  ?Resp: (!) '9 18 18 18  '$ ?Temp:  97.8 ?F (36.6 ?C)  (!) 97.5 ?F (36.4 ?C) (!) 97.4 ?F (36.3 ?C)  ?TempSrc:  Oral    ?SpO2: 100% 98% 99% 100%  ?Weight:  62 kg    ?Height:  '5\' 2"'$  (1.575 m)    ? ? ?Intake/Output Summary (Last 24 hours) at 05/12/2021 1004 ?Last data filed at 05/12/2021 0600 ?Gross per 24 hour  ?Intake 1798.09 ml  ?Output 1400 ml  ?Net 398.09 ml  ? ?Filed Weights  ? 05/11/21 0726 05/11/21 1726  ?Weight: 70.3 kg 62 kg  ? ? ?Examination: ? ?General exam: Overall comfortable, not in distress ?HEENT: PERRL ?Respiratory system:  no wheezes or crackles  ?Cardiovascular system: S1 & S2 heard, RRR.  ?Gastrointestinal system: Abdomen is nondistended, soft .  Generalized tenderness on deep palpation ?Central nervous system: Alert and oriented ?Extremities: No edema, no clubbing ,no cyanosis ?Skin: No rashes, no ulcers,no icterus   ? ? ?Data Reviewed: I have personally reviewed following labs and imaging studies ? ?CBC: ?Recent Labs  ?Lab 05/09/21 ?1020 05/11/21 ?0745 05/12/21 ?6962  ?WBC 7.5 5.5 7.3  ?HGB 16.4* 15.7* 15.1*  ?HCT 46.3* 43.7 41.7  ?MCV 93.9 91.6 92.3  ?PLT 322 342 312  ? ?Basic Metabolic Panel: ?Recent Labs  ?Lab 05/09/21 ?1020 05/11/21 ?0745 05/12/21 ?9528  ?NA 138 138 136  ?K 3.8 3.3* 3.7  ?CL 105 106 104  ?CO2 25 19* 22  ?GLUCOSE 110* 97 103*  ?BUN '15 19 14  '$ ?CREATININE 0.68 0.56 0.50  ?CALCIUM 9.5 9.5 9.7  ? ? ? ?Recent Results (from the past 240 hour(s))  ?Urine Culture     Status: Abnormal  ? Collection Time: 05/09/21 10:22 AM  ? Specimen: Urine, Clean Catch  ?  Result Value Ref Range Status  ? Specimen Description   Final  ?  URINE, CLEAN CATCH ?Performed at [facility], [place] [address], [place], [place] [address] ?  ? Special Requests   Final  ?  NONE ?Performed at [facility], [place] [address], [place], [place] [address] ?  ? Culture MULTIPLE SPECIES PRESENT, SUGGEST RECOLLECTION (A)  Final  ? Report Status 05/11/2021 FINAL  Final  ?  ? ?Radiology Studies: ?DG Abdomen Acute W/Chest ? ?Result Date:  05/11/2021 ?CLINICAL DATA:  Abdominal pain EXAM: DG ABDOMEN ACUTE WITH 1 VIEW CHEST COMPARISON:  None. FINDINGS: Cardiac size is within normal limits. Lung fields are clear of any infiltrates or pulmonary edema. There is no pleural effusion or pneumothorax. Bowel gas pattern is nonspecific. Small amount of stool is seen in colon without signs of fecal impaction in the rectum. There is no pneumoperitoneum. No abnormal masses or calcifications. Kidneys are partly obscured by bowel contents. Degenerative changes are noted in lower lumbar spine. IMPRESSION: There is no evidence of intestinal obstruction or pneumoperitoneum. No abnormal masses or calcifications are seen. No active disease is seen in the chest. Electronically Signed   By: Elmer Picker M.D.   On: 05/11/2021 10:55   ? ?Scheduled Meds: ? metoCLOPramide (REGLAN) injection  10 mg Intravenous Q8H  ? pantoprazole (PROTONIX) IV  40 mg Intravenous Q24H  ? ?Continuous Infusions: ? lactated ringers 125 mL/hr at 05/12/21 0223  ? ? ? LOS: 0 days  ? ?Shelly Coss, MD ?[facility] ?P4/26/2023, 10:04 AM   ?

## 2021-05-12 NOTE — Progress Notes (Signed)
Transition of Care (TOC) Screening Note ? ?Patient Details  ?Name: Carrie Jensen ?Date of Birth: 1970-05-25 ? ?Transition of Care (TOC) CM/SW Contact:    ?Sherie Don, LCSW ?Phone Number: ?05/12/2021, 11:13 AM ? ?Transition of Care Department [facility]) has reviewed patient and no TOC needs have been identified at this time. We will continue to monitor patient advancement through interdisciplinary progression rounds. If new patient transition needs arise, please place a TOC consult. ?

## 2021-05-13 ENCOUNTER — Inpatient Hospital Stay (HOSPITAL_COMMUNITY): Payer: BC Managed Care – PPO

## 2021-05-13 DIAGNOSIS — K8000 Calculus of gallbladder with acute cholecystitis without obstruction: Secondary | ICD-10-CM

## 2021-05-13 DIAGNOSIS — R109 Unspecified abdominal pain: Secondary | ICD-10-CM | POA: Diagnosis not present

## 2021-05-13 DIAGNOSIS — R1011 Right upper quadrant pain: Secondary | ICD-10-CM

## 2021-05-13 DIAGNOSIS — R112 Nausea with vomiting, unspecified: Secondary | ICD-10-CM | POA: Diagnosis not present

## 2021-05-13 LAB — BASIC METABOLIC PANEL
Anion gap: 7 (ref 5–15)
BUN: 11 mg/dL (ref 6–20)
CO2: 26 mmol/L (ref 22–32)
Calcium: 8.8 mg/dL — ABNORMAL LOW (ref 8.9–10.3)
Chloride: 106 mmol/L (ref 98–111)
Creatinine, Ser: 0.62 mg/dL (ref 0.44–1.00)
GFR, Estimated: >60 mL/min (ref >60)
Glucose, Bld: 90 mg/dL (ref 70–99)
Potassium: 3.4 mmol/L — ABNORMAL LOW (ref 3.5–5.1)
Sodium: 139 mmol/L (ref 135–145)

## 2021-05-13 LAB — HEPATITIS PANEL, ACUTE
HCV Ab: NONREACTIVE
Hep A IgM: NONREACTIVE
Hep B C IgM: NONREACTIVE
Hepatitis B Surface Ag: NONREACTIVE

## 2021-05-13 LAB — HEPATIC FUNCTION PANEL
ALT: 16 U/L (ref 0–44)
AST: 23 U/L (ref 15–41)
Albumin: 3.3 g/dL — ABNORMAL LOW (ref 3.5–5.0)
Alkaline Phosphatase: 54 U/L (ref 38–126)
Bilirubin, Direct: 0.3 mg/dL — ABNORMAL HIGH (ref 0.0–0.2)
Indirect Bilirubin: 1.3 mg/dL — ABNORMAL HIGH (ref 0.3–0.9)
Total Bilirubin: 1.6 mg/dL — ABNORMAL HIGH (ref 0.3–1.2)
Total Protein: 5.8 g/dL — ABNORMAL LOW (ref 6.5–8.1)

## 2021-05-13 MED ORDER — MORPHINE BOLUS VIA INFUSION: 2.0000 mg | Freq: Once | INTRAVENOUS | Status: DC

## 2021-05-13 MED ORDER — HYDROMORPHONE HCL 1 MG/ML IJ SOLN
0.5000 mg | Freq: Once | INTRAMUSCULAR | Status: AC | PRN
  Administered 2021-05-13: 0.5 mg via INTRAVENOUS
  Filled 2021-05-13: qty 0.5

## 2021-05-13 MED ORDER — POTASSIUM CHLORIDE 10 MEQ/100ML IV SOLN
INTRAVENOUS | Status: AC
  Administered 2021-05-13: 10 meq via INTRAVENOUS
  Filled 2021-05-13: qty 100

## 2021-05-13 MED ORDER — MORPHINE SULFATE (PF) 2 MG/ML IV SOLN
2.0000 mg | Freq: Once | INTRAVENOUS | Status: AC
  Administered 2021-05-13: 2 mg via INTRAVENOUS
  Filled 2021-05-13: qty 1

## 2021-05-13 MED ORDER — POTASSIUM CHLORIDE 10 MEQ/100ML IV SOLN
10.0000 meq | INTRAVENOUS | Status: AC
  Administered 2021-05-13: 10 meq via INTRAVENOUS
  Filled 2021-05-13 (×2): qty 100

## 2021-05-13 MED ORDER — POTASSIUM CHLORIDE 10 MEQ/100ML IV SOLN
INTRAVENOUS | Status: AC
  Administered 2021-05-13: 10 meq
  Filled 2021-05-13: qty 100

## 2021-05-13 MED ORDER — HYDROMORPHONE HCL 1 MG/ML IJ SOLN
0.2500 mg | Freq: Once | INTRAMUSCULAR | Status: AC | PRN
  Administered 2021-05-13: 0.25 mg via INTRAVENOUS
  Filled 2021-05-13: qty 0.5

## 2021-05-13 MED ORDER — SODIUM CHLORIDE 0.9 % IV SOLN
2.0000 g | Freq: Every day | INTRAVENOUS | Status: DC
  Administered 2021-05-13 – 2021-05-14 (×2): 2 g via INTRAVENOUS
  Filled 2021-05-13 (×2): qty 20

## 2021-05-13 MED ORDER — TECHNETIUM TC 99M MEBROFENIN IV KIT
5.4000 | PACK | Freq: Once | INTRAVENOUS | Status: AC | PRN
  Administered 2021-05-13: 5.4 via INTRAVENOUS

## 2021-05-13 NOTE — Progress Notes (Addendum)
? ? ? ?[facility] Progress Note ? ?CC:  Nausea and vomiting, abdominal pain ? ? Attending physician's note  ? ?I have taken a history, reviewed the chart and examined the patient. I performed a substantive portion of this encounter, including complete performance of at least one of the key components, in conjunction with the APP. I agree with the APP's note, impression and recommendations.  ?  ?No longer vomiting but still has nausea.  She continues to have epigastric and right upper quadrant discomfort ?On abdominal exam she has right upper quadrant tenderness with positive Murphy sign ? ?HIDA scan positive for acute cholecystitis ?Surgery consulted for cholecystectomy ? ?GI is available if needed, please call with any questions ? ? The patient was provided an opportunity to ask questions and all were answered. The patient agreed with the plan and demonstrated an understanding of the instructions. ? ? ?K. Denzil Magnuson , MD ?[phone]    ? ?Subjective:  Feels a little better today, but still with some pain in RUQ and mid-abdomen.  Nausea still present but no vomiting.  Says that she gets very hot.   ? ?Objective:  ?Vital signs in last 24 hours: ?Temp:  [97.8 ?F (36.6 ?C)-98.8 ?F (37.1 ?C)] 98.1 ?F (36.7 ?C) (04/27 8588) ?Pulse Rate:  [66-74] 66 (04/27 5027) ?Resp:  [15-18] 18 (04/27 7412) ?BP: (113-128)/(63-80) 119/80 (04/27 8786) ?SpO2:  [99 %-100 %] 99 % (04/27 0605) ?Last BM Date : 05/10/21 ?General:  Alert, Well-developed, in NAD ?Heart:  Regular rate and rhythm; no murmurs ?Pulm:  CTAB.  No W/R/R. ?Abdomen:  Soft, non-distended.  BS present.  Mid to RUQ TTP. ?Extremities:  Without edema. ?Neurologic:  Alert and oriented x 4;  grossly normal neurologically. ?Psych:  Alert and cooperative. Normal mood and affect. ? ?Intake/Output from previous day: ?04/26 0701 - 04/27 0700 ?In: 7672 [P.O.:180; I.V.:1135; IV Piggyback:50] ?Out: 2600 [Urine:2600] ? ?Lab Results: ?Recent Labs  ?  05/11/21 ?0745  05/12/21 ?0947  ?WBC 5.5 7.3  ?HGB 15.7* 15.1*  ?HCT 43.7 41.7  ?PLT 342 312  ? ?BMET ?Recent Labs  ?  05/11/21 ?0745 05/12/21 ?0962 05/13/21 ?0352  ?NA 138 136 139  ?K 3.3* 3.7 3.4*  ?CL 106 104 106  ?CO2 19* 22 26  ?GLUCOSE 97 103* 90  ?BUN '19 14 11  '$ ?CREATININE 0.56 0.50 0.62  ?CALCIUM 9.5 9.7 8.8*  ? ?LFT ?Recent Labs  ?  05/13/21 ?0352  ?PROT 5.8*  ?ALBUMIN 3.3*  ?AST 23  ?ALT 16  ?ALKPHOS 54  ?BILITOT 1.6*  ?BILIDIR 0.3*  ?IBILI 1.3*  ? ?DG Abdomen Acute W/Chest ? ?Result Date: 05/11/2021 ?CLINICAL DATA:  Abdominal pain EXAM: DG ABDOMEN ACUTE WITH 1 VIEW CHEST COMPARISON:  None. FINDINGS: Cardiac size is within normal limits. Lung fields are clear of any infiltrates or pulmonary edema. There is no pleural effusion or pneumothorax. Bowel gas pattern is nonspecific. Small amount of stool is seen in colon without signs of fecal impaction in the rectum. There is no pneumoperitoneum. No abnormal masses or calcifications. Kidneys are partly obscured by bowel contents. Degenerative changes are noted in lower lumbar spine. IMPRESSION: There is no evidence of intestinal obstruction or pneumoperitoneum. No abnormal masses or calcifications are seen. No active disease is seen in the chest. Electronically Signed   By: Elmer Picker M.D.   On: 05/11/2021 10:55   ? ?Assessment / Plan: ?Post procedure nausea, vomiting, and mid abdominal pain.  ?CT scan, KUB and labs unrevealing except she  does have gallstones.  Getting scheduled Reglan, also getting Zofran. Requiring Dilaudid for pain.  HIDA scan today shows findings suggestive of acute cholecystitis. ?  ?Volume depletion ?Treated with liter bolus of fluid. Getting LR at 125 ml / hr ?  ?IBS with constipation ?Takes Trulance at home. ?  ?Cholelithiasis ?  ?Probable Gilbert's syndrome with minimal elevation of total bili ?  ?Liver lesions on CT scan.  ?There is a 8.2 x 7.2 cm simple cyst in the right lobe liver. Smaller subcentimeter cysts are identified in the liver. In  the inferior anterior liver, there is a 2.8 x 1.5 cm enhancing mass which become isodense delayed images, favor hemangioma. ?  ?Uterine mass on CT scan, 5.8 cm and possibly a fibroid.  ?Last GYN visit in epic was Feb 2023 and without mention of uterine fibroids. Patient wasn't aware of the fibroid. ? ?-Spoke with surgery.  They will see her consult to discuss possible cholecystectomy. ?  ? ? LOS: 1 day  ? ?Laban Emperor. Zehr  05/13/2021, 9:50 AM ? ?  ?

## 2021-05-13 NOTE — Consult Note (Addendum)
? ? ? ?Carrie Jensen ?01/12/1971  ?[id number].   ? ?Requesting MD: Dr. Shelly Coss ?Chief Complaint/Reason for Consult: Acute Cholecystitis  ? ?HPI: Carrie Jensen is a 51 y.o. female who presented ot the ED on 4/25 w/ abdominal pain.  ? ?Patient underwent Colonoscopy on 4/21 by Dr. Dayna Barker that showed nonbleeding internal hemorrhoids but otherwise was reassuring and no specimens were collected. Later that night she began having constant, generalized abdominal pain with associated nausea, vomiting and diarrhea.  She reports she was unable to tolerate p.o. intake without worsening of her abdominal pain, nausea and would have emesis shortly after.  She called GI and was recommended go to urgent care.  Urgent care directed her to the ED.  On 4/23 she was evaluated in the ED that showed normal WBC, normal lipase; T. bili 1.8 & AST 45 but LFT's otherwise wnl. CT A/P w/ Cholelithiasis without acute Cholecystitis, 8.2cm cyst of the R lobe of the liver, 2.8 cm mass in the inferior anterior liver (favoring hemangioma). There was no evidence of pneumoperitoneum.  She was discharged home but symptoms persisted so she represented to the ED on 4/25. Xray obtained and negative for free air with nonspecific bowel gas pattern but no signs of obstruction. WBC wnl, Lipase wnl, T.Bili 1.6, LFT's otherwise wnl. Admitted to [facility] for further workup w/ GI following. HIDA scan obtained and confirmed Acute Cholecystitis. We were asked to see.  Patient reports persistent generalized abdominal pain that is greatest in the epigastrium with associated nausea.  No further emesis today.  Her last BM was 2 days ago.  She still passing flatus.  No history of similar symptoms in the past.  Does feel like p.o. intake makes her symptoms worse. She is not on any blood thinners.  ? ?PMHx: GERD, CIN (cergical intraepithelial neoplasia 2) s/p LEEP, and hyperthyroidism.  ?Past Surgical Hx: Remote hx of removal of endometriosis and fibroid  removal via Pfannenstiel incision. Otherwise none.  ? ?ROS: ?Review of Systems  ?Constitutional:  Positive for chills. Negative for fever.  ?Respiratory:  Negative for cough and shortness of breath.   ?Cardiovascular:  Negative for chest pain.  ?Gastrointestinal:  Positive for abdominal pain, diarrhea, nausea and vomiting.  ?Genitourinary: Negative.   ? ?Family History  ?Problem Relation Age of Onset  ? Colon polyps Brother   ? Cancer Maternal Aunt   ?     OVARIAN  ? Heart disease Maternal Grandmother   ? Stroke Maternal Grandmother   ? Diabetes Maternal Grandfather   ? Hypertension Maternal Grandfather   ? Heart disease Maternal Grandfather   ? Heart disease Paternal Grandmother   ? Heart disease Paternal Grandfather   ? Breast cancer Neg Hx   ? Colon cancer Neg Hx   ? Esophageal cancer Neg Hx   ? Stomach cancer Neg Hx   ? Rectal cancer Neg Hx   ? ? ?Past Medical History:  ?Diagnosis Date  ? Anxiety   ? Cancer [facility])   ? CIN II (cervical intraepithelial neoplasia II) 1998  ? LEEP  ? Depression   ? Endometriosis   ? GERD (gastroesophageal reflux disease)   ? Hyperthyroidism   ? DR. BALAN  ? IBS (irritable bowel syndrome)   ? Migraines   ? Plantar fasciitis of left foot   ? ? ?Past Surgical History:  ?Procedure Laterality Date  ? CERVICAL BIOPSY  W/ LOOP ELECTRODE EXCISION  01/18/1996  ? CIN II  ? COLONOSCOPY    ?  MOHS SURGERY    ? OVARIAN CYSTECTOMY, PARTIAL LSO, FULGURATION OF ENDOMETRIOSIS    ? ? ?Social History:  reports that she has never smoked. She has never used smokeless tobacco. She reports that she does not drink alcohol and does not use drugs. ?She is a Marine scientist ?Lives at home with her husband and 2 dogs ?No alcohol use or tobacco use ? ?Allergies:  ?Allergies  ?Allergen Reactions  ? Erythromycin Nausea And Vomiting  ? Other Nausea And Vomiting and Other (See Comments)  ?  Possible reaction to Anesthesia (afterwards) = "Cold/hot feeling, cannot keep down food, N/V, and stomach pain"  ? ? ?Medications Prior to  Admission  ?Medication Sig Dispense Refill  ? ALPRAZolam (XANAX) 0.25 MG tablet TAKE ONE TABLET BY MOUTH EVERY NIGHT AT BEDTIME AS NEEDED FOR ANXIETY OR SLEEP (Patient taking differently: Take 0.25 mg by mouth daily as needed for anxiety.) 30 tablet 0  ? CALCIUM PO Take 2 tablets by mouth at bedtime.    ? Carboxymethylcell-Hypromellose (GENTEAL OP) Place 1 drop into both eyes every morning.    ? Cholecalciferol (VITAMIN D3 PO) Take 1 capsule by mouth at bedtime.    ? Cyanocobalamin (VITAMIN B-12 PO) Take 1 tablet by mouth every morning.    ? ibuprofen (ADVIL) 200 MG tablet Take 400 mg by mouth daily as needed (minor discomfort).    ? Melatonin 10 MG TABS Take 10 mg by mouth at bedtime.    ? Multiple Vitamins-Minerals (ZINC PO) Take 1 tablet by mouth at bedtime.    ? Omega-3 Fatty Acids (FISH OIL PO) Take 1 capsule by mouth at bedtime.    ? omeprazole (PRILOSEC OTC) 20 MG tablet Take 20 mg by mouth at bedtime.    ? ondansetron (ZOFRAN-ODT) 4 MG disintegrating tablet Take 1 tablet (4 mg total) by mouth every 8 (eight) hours as needed for nausea or vomiting. (Patient taking differently: Take 4 mg by mouth every 6 (six) hours as needed for nausea or vomiting (dissolve orally).) 12 tablet 1  ? Plecanatide (TRULANCE) 3 MG TABS Take 3 mg by mouth every morning. 30 tablet 1  ? Probiotic Product (PROBIOTIC-10 PO) Take 1 tablet by mouth at bedtime.    ? SUMAtriptan (IMITREX) 50 MG tablet Take 1 tablet (50 mg total) by mouth every 2 (two) hours as needed for migraine. May repeat in 2 hours if headache persists or recurs. (Patient taking differently: Take 50 mg by mouth See admin instructions. Take one tablet (50 mg) by mouth at onset of migraine headache and may repeat in 2 hours if headache persists or recurs) 10 tablet 6  ? venlafaxine XR (EFFEXOR-XR) 150 MG 24 hr capsule TAKE ONE CAPSULE BY MOUTH ONCE DAILY WITH BREAKFAST (Patient taking differently: 150 mg daily with breakfast.) 90 capsule 4  ? vitamin C (ASCORBIC ACID)  500 MG tablet Take 500 mg by mouth at bedtime.    ? norethindrone-ethinyl estradiol (JUNEL 1/20) 1-20 MG-MCG tablet TAKE ONE TABLET BY MOUTH DAILY CONTINUOUSLY (Patient not taking: Reported on 04/16/2021) 21 tablet 3  ? promethazine (PHENERGAN) 25 MG tablet Take 1 tablet (25 mg total) by mouth every 6 (six) hours as needed for nausea. (Patient not taking: Reported on 05/11/2021) 30 tablet 0  ? ? ? ?Physical Exam: ?Blood pressure 119/80, pulse 66, temperature 98.1 ?F (36.7 ?C), temperature source Oral, resp. rate 18, height '5\' 2"'$  (1.575 m), weight 62 kg, SpO2 99 %. ?General: pleasant, WD/WN white female who is laying in bed in  NAD ?HEENT: head is normocephalic, atraumatic.  Sclera are noninjected.  PERRL.  Ears and nose without any masses or lesions.  Mouth is pink and moist. Dentition fair ?Heart: regular, rate, and rhythm.   Palpable pedal pulses bilaterally  ?Lungs: CTAB, no wheezes, rhonchi, or rales noted.  Respiratory effort nonlabored ?Abd:  Soft, ND, generalized tenderness greatest in the epigastrium. Neg Murphy's sign. +BS, no masses, hernias, or organomegaly. Prior Pfannenstiel scar well healed.  ?MS: no BUE/BLE edema, calves soft and nontender ?Skin: warm and dry with no masses, lesions, or rashes ?Psych: A&Ox4 with an appropriate affect ?Neuro: cranial nerves grossly intact, equal strength in BUE/BLE bilaterally, normal speech, thought process intact, moves all extremities, gait not assessed ? ? ?Results for orders placed or performed during the hospital encounter of 05/11/21 (from the past 48 hour(s))  ?HIV Antibody (routine testing w rflx)     Status: None  ? Collection Time: 05/12/21  4:47 AM  ?Result Value Ref Range  ? HIV Screen 4th Generation wRfx Non Reactive Non Reactive  ?  Comment: Performed at [facility], [place] [address], [place], [place] [address]  ?CBC     Status: Abnormal  ? Collection Time: 05/12/21  4:47 AM  ?Result Value Ref Range  ? WBC 7.3 4.0 - 10.5 K/uL  ? RBC 4.52 3.87 - 5.11  MIL/uL  ? Hemoglobin 15.1 (H) 12.0 - 15.0 g/dL  ? HCT 41.7 36.0 - 46.0 %  ? MCV 92.3 80.0 - 100.0 fL  ? MCH 33.4 26.0 - 34.0 pg  ? MCHC 36.2 (H) 30.0 - 36.0 g/dL  ? RDW 11.0 (L) 11.5 - 15.5 %  ? [place]

## 2021-05-13 NOTE — Progress Notes (Signed)
Initial Nutrition Assessment ? ?INTERVENTION:  ? ?Once diet advanced: ?-Ensure Plus High Protein po BID, each supplement provides 350 kcal and 20 grams of protein.  ? ?-Multivitamin with minerals daily ? ?NUTRITION DIAGNOSIS:  ? ?Increased nutrient needs related to acute illness as evidenced by estimated needs. ? ?GOAL:  ? ?Patient will meet greater than or equal to 90% of their needs ? ?MONITOR:  ? ?PO intake, Supplement acceptance, Labs, Weight trends, I & O's ? ?REASON FOR ASSESSMENT:  ? ?Malnutrition Screening Tool ?  ? ?ASSESSMENT:  ? ?51 y.o. female with a pmh not limited to cholelithiasis, IBS with constipation, anxiety, depression, migraines, hyperthyroidism. She underwent screening colonoscopy on 4/21 which was normal. Since colonoscopy she has been having nausea, vomiting, periumbilical pain and some small pieces of formed black stool. ? ?Patient NPO for HIDA scan. Spoke with pt and pt's husband at bedside following procedure. Diet has since been advanced to clear liquids.  ?Per GI note, HIDA showed acute cholecystitis.  ?Per surgery note, plan is for cholecystectomy 4/28.  ? ?Pt endorses symptom onset following colonoscopy on 4/21. Pt had not eaten very much since then d/t N/V. Pt last vomited when trying to eat breakfast yesterday. ?Typically pt has a good appetite, eats 2 meals daily. ?Pt does not like Boost Breeze. Agreed to Ensure supplements once diet is advanced. ? ?Per pt, UBW is 155 lbs. Current weight is 136 lbs. ?Per weight records, pt has lost 8 lbs since 2/20 (5% wt loss x 2 months, insignificant for time frame). ? ?Medications: Reglan, Lactated ringers ? ?Labs reviewed: ? Low K ? ?NUTRITION - FOCUSED PHYSICAL EXAM: ? ?No depletions noted. ? ?Diet Order:   ?Diet Order   ? ?       ?  Diet NPO time specified Except for: Sips with Meds  Diet effective midnight       ?  ?  Diet clear liquid Room service appropriate? Yes; Fluid consistency: Thin  Diet effective now       ?  ? ?  ?  ? ?   ? ? ?EDUCATION NEEDS:  ? ?No education needs have been identified at this time ? ?Skin:  Skin Assessment: Reviewed RN Assessment ? ?Last BM:  4/25 ? ?Height:  ? ?Ht Readings from Last 1 Encounters:  ?05/11/21 '5\' 2"'$  (1.575 m)  ? ? ?Weight:  ? ?Wt Readings from Last 1 Encounters:  ?05/11/21 62 kg  ? ? ?BMI:  Body mass index is 25 kg/m?. ? ?Estimated Nutritional Needs:  ? ?Kcal:  1600-1800 ? ?Protein:  75-85g ? ?Fluid:  1.8L/day ? ?Clayton Bibles, MS, RD, LDN ?Inpatient Clinical Dietitian ?Contact information available via Amion ? ?

## 2021-05-13 NOTE — Plan of Care (Addendum)

## 2021-05-13 NOTE — Progress Notes (Signed)
?PROGRESS NOTE ? ?Carrie Jensen  DPO:[id number] DOB: [birth year] DOA: 05/11/2021 ?PCP: Pcp, No  ? ?Brief Narrative: ? ?Patient is a 51 year old female with history of anxiety, depression, cervical cancer, endometriosis, GERD, hypothyroidism, migraine headache who presented with abdominal pain, nausea, vomiting after colonoscopy.  She had undergone colonoscopy on 4/21 after that she developed the symptoms.  On presentation, she was hemodynamically stable.  Patient was admitted for the management of abdomen pain, nausea, vomiting.  Currently on IV fluids, antiemetics.  GI also recommending HIDA scan to exclude gallbladder dysfunction. ? ?Assessment & Plan: ? ?Principal Problem: ?  Intractable abdominal pain ?Active Problems: ?  Hyperthyroidism ?  Hyperbilirubinemia ?  Hypokalemia ?  Cholelithiasis ?  Intractable nausea and vomiting ? ?Intractable abdominal pain/nausea/vomiting: Symptoms started after colonoscopy.  She was having abdominal discomfort, nausea, vomiting.  She feels better today.  Abdominal pain  slowing down.  No vomiting continue current management with IV fluids, pain management, PPI.  GI following. ?GI recommending HIDA scan.  She has epigastric tenderness ? ?Hyperbilirubinemia: Mild.  Abdominal imaging showed cholelithiasis without evidence of cholecystitis.  Liver function test is stable.  Lipase negative ? ?Hypokalemia: Supplemented with potassium ? ?Uterine fibroid: We recommend outpatient follow-up with GYN. ? ? ?  ?  ?  ? ?DVT prophylaxis:SCDs Start: 05/11/21 1603 ? ? ?  Code Status: Full Code ? ?Family Communication: Husband present at bedside ? ?Patient status:Inpatient ? ?Patient is from :Home ? ?Anticipated discharge ER:[facility] ? ?Estimated DC date:tomorrow ? ? ?Consultants: GI ? ?Procedures:None ? ?Antimicrobials:  ?Anti-infectives (From admission, onward)  ? ? None  ? ?  ? ? ?Subjective: ?Patient seen and examined at the bedside this morning.  Hemodynamically stable.  She feels better  today.  Abdominal pain has improved.  She has not vomited.  We discussed about the plan from gastroenterology about HIDA scan.  She is not comfortable to go home today. ? ?Objective: ?Vitals:  ? 05/12/21 0511 05/12/21 1249 05/12/21 2103 05/13/21 0867  ?BP: 123/78 128/63 113/67 119/80  ?Pulse: 85 74 70 66  ?Resp: '18 15 18 18  '$ ?Temp: (!) 97.4 ?F (36.3 ?C) 97.8 ?F (36.6 ?C) 98.8 ?F (37.1 ?C) 98.1 ?F (36.7 ?C)  ?TempSrc:  Oral Oral Oral  ?SpO2: 100% 100% 100% 99%  ?Weight:      ?Height:      ? ? ?Intake/Output Summary (Last 24 hours) at 05/13/2021 1036 ?Last data filed at 05/13/2021 0600 ?Gross per 24 hour  ?Intake 1305.01 ml  ?Output 2600 ml  ?Net -1294.99 ml  ? ?Filed Weights  ? 05/11/21 0726 05/11/21 1726  ?Weight: 70.3 kg 62 kg  ? ? ?Examination: ? ?General exam: Overall comfortable, not in distress ?HEENT: PERRL ?Respiratory system:  no wheezes or crackles  ?Cardiovascular system: S1 & S2 heard, RRR.  ?Gastrointestinal system: Abdomen is nondistended, soft and nontender.  Epigastric tenderness ?Central nervous system: Alert and oriented ?Extremities: No edema, no clubbing ,no cyanosis ?Skin: No rashes, no ulcers,no icterus   ? ? ?Data Reviewed: I have personally reviewed following labs and imaging studies ? ?CBC: ?Recent Labs  ?Lab 05/09/21 ?1020 05/11/21 ?0745 05/12/21 ?6195  ?WBC 7.5 5.5 7.3  ?HGB 16.4* 15.7* 15.1*  ?HCT 46.3* 43.7 41.7  ?MCV 93.9 91.6 92.3  ?PLT 322 342 312  ? ?Basic Metabolic Panel: ?Recent Labs  ?Lab 05/09/21 ?1020 05/11/21 ?0745 05/12/21 ?0932 05/13/21 ?0352  ?NA 138 138 136 139  ?K 3.8 3.3* 3.7 3.4*  ?CL 105 106 104 106  ?  CO2 25 19* 22 26  ?GLUCOSE 110* 97 103* 90  ?BUN '15 19 14 11  '$ ?CREATININE 0.68 0.56 0.50 0.62  ?CALCIUM 9.5 9.5 9.7 8.8*  ? ? ? ?Recent Results (from the past 240 hour(s))  ?Urine Culture     Status: Abnormal  ? Collection Time: 05/09/21 10:22 AM  ? Specimen: Urine, Clean Catch  ?Result Value Ref Range Status  ? Specimen Description   Final  ?  URINE, CLEAN CATCH ?Performed  at [facility], [place] [address], [place], [place] [address] ?  ? Special Requests   Final  ?  NONE ?Performed at [facility], [place] [address], [place], [place] [address] ?  ? Culture MULTIPLE SPECIES PRESENT, SUGGEST RECOLLECTION (A)  Final  ? Report Status 05/11/2021 FINAL  Final  ?  ? ?Radiology Studies: ?DG Abdomen Acute W/Chest ? ?Result Date: 05/11/2021 ?CLINICAL DATA:  Abdominal pain EXAM: DG ABDOMEN ACUTE WITH 1 VIEW CHEST COMPARISON:  None. FINDINGS: Cardiac size is within normal limits. Lung fields are clear of any infiltrates or pulmonary edema. There is no pleural effusion or pneumothorax. Bowel gas pattern is nonspecific. Small amount of stool is seen in colon without signs of fecal impaction in the rectum. There is no pneumoperitoneum. No abnormal masses or calcifications. Kidneys are partly obscured by bowel contents. Degenerative changes are noted in lower lumbar spine. IMPRESSION: There is no evidence of intestinal obstruction or pneumoperitoneum. No abnormal masses or calcifications are seen. No active disease is seen in the chest. Electronically Signed   By: Elmer Picker M.D.   On: 05/11/2021 10:55   ? ?Scheduled Meds: ? metoCLOPramide (REGLAN) injection  10 mg Intravenous Q8H  ? pantoprazole (PROTONIX) IV  40 mg Intravenous Q24H  ? ?Continuous Infusions: ? lactated ringers 125 mL/hr at 05/13/21 0316  ? potassium chloride    ? promethazine (PHENERGAN) injection (IM or IVPB) 12.5 mg (05/12/21 1858)  ? ? ? LOS: 1 day  ? ?Shelly Coss, MD ?[facility] ?P4/27/2023, 10:36 AM   ?

## 2021-05-14 ENCOUNTER — Encounter (HOSPITAL_COMMUNITY)
Admission: EM | Disposition: A | Discharge status: Home / Self Care | Payer: Self-pay | Source: Home / Self Care | Attending: Internal Medicine

## 2021-05-14 ENCOUNTER — Inpatient Hospital Stay (HOSPITAL_COMMUNITY): Payer: BC Managed Care – PPO | Admitting: Certified Registered Nurse Anesthetist

## 2021-05-14 ENCOUNTER — Inpatient Hospital Stay (HOSPITAL_COMMUNITY): Payer: BC Managed Care – PPO

## 2021-05-14 ENCOUNTER — Other Ambulatory Visit: Payer: Self-pay

## 2021-05-14 DIAGNOSIS — R109 Unspecified abdominal pain: Secondary | ICD-10-CM | POA: Diagnosis not present

## 2021-05-14 HISTORY — PX: CHOLECYSTECTOMY: SHX55

## 2021-05-14 LAB — COMPREHENSIVE METABOLIC PANEL
ALT: 16 U/L (ref 0–44)
AST: 19 U/L (ref 15–41)
Albumin: 3.7 g/dL (ref 3.5–5.0)
Alkaline Phosphatase: 59 U/L (ref 38–126)
Anion gap: 11 (ref 5–15)
BUN: 8 mg/dL (ref 6–20)
CO2: 23 mmol/L (ref 22–32)
Calcium: 9.4 mg/dL (ref 8.9–10.3)
Chloride: 104 mmol/L (ref 98–111)
Creatinine, Ser: 0.56 mg/dL (ref 0.44–1.00)
GFR, Estimated: >60 mL/min (ref >60)
Glucose, Bld: 90 mg/dL (ref 70–99)
Potassium: 3.6 mmol/L (ref 3.5–5.1)
Sodium: 138 mmol/L (ref 135–145)
Total Bilirubin: 1.5 mg/dL — ABNORMAL HIGH (ref 0.3–1.2)
Total Protein: 6.5 g/dL (ref 6.5–8.1)

## 2021-05-14 LAB — CBC
HCT: 43.8 % (ref 36.0–46.0)
Hemoglobin: 15.4 g/dL — ABNORMAL HIGH (ref 12.0–15.0)
MCH: 32.6 pg (ref 26.0–34.0)
MCHC: 35.2 g/dL (ref 30.0–36.0)
MCV: 92.6 fL (ref 80.0–100.0)
Platelets: 308 10*3/uL (ref 150–400)
RBC: 4.73 MIL/uL (ref 3.87–5.11)
RDW: 11 % — ABNORMAL LOW (ref 11.5–15.5)
WBC: 6.5 10*3/uL (ref 4.0–10.5)
nRBC: 0 % (ref 0.0–0.2)

## 2021-05-14 LAB — SURGICAL PCR SCREEN
MRSA, PCR: NEGATIVE
Staphylococcus aureus: NEGATIVE

## 2021-05-14 SURGERY — LAPAROSCOPIC CHOLECYSTECTOMY WITH INTRAOPERATIVE CHOLANGIOGRAM
Anesthesia: General | Site: Abdomen

## 2021-05-14 MED ORDER — FENTANYL CITRATE (PF) 100 MCG/2ML IJ SOLN
INTRAMUSCULAR | Status: AC
Start: 2021-05-14 — End: ?
  Filled 2021-05-14: qty 2

## 2021-05-14 MED ORDER — MIDAZOLAM HCL 5 MG/5ML IJ SOLN
INTRAMUSCULAR | Status: DC | PRN
  Administered 2021-05-14: 2 mg via INTRAVENOUS

## 2021-05-14 MED ORDER — SCOPOLAMINE 1 MG/3DAYS TD PT72
1.0000 | MEDICATED_PATCH | TRANSDERMAL | Status: DC
  Administered 2021-05-14: 1 via TRANSDERMAL

## 2021-05-14 MED ORDER — MEPERIDINE HCL 50 MG/ML IJ SOLN: 6.2500 mg | INTRAMUSCULAR | Status: DC | PRN

## 2021-05-14 MED ORDER — ACETAMINOPHEN 10 MG/ML IV SOLN
1000.0000 mg | Freq: Once | INTRAVENOUS | Status: AC
  Administered 2021-05-14: 1000 mg via INTRAVENOUS

## 2021-05-14 MED ORDER — MORPHINE SULFATE (PF) 2 MG/ML IV SOLN
2.0000 mg | INTRAVENOUS | Status: DC | PRN
  Administered 2021-05-14 – 2021-05-15 (×3): 2 mg via INTRAVENOUS
  Filled 2021-05-14 (×3): qty 1

## 2021-05-14 MED ORDER — LIDOCAINE 2% (20 MG/ML) 5 ML SYRINGE
INTRAMUSCULAR | Status: DC | PRN
Start: 2021-05-14 — End: 2021-05-14
  Administered 2021-05-14: 60 mg via INTRAVENOUS

## 2021-05-14 MED ORDER — MORPHINE SULFATE (PF) 2 MG/ML IV SOLN
2.0000 mg | INTRAVENOUS | Status: DC | PRN
  Administered 2021-05-14: 2 mg via INTRAVENOUS
  Filled 2021-05-14: qty 1

## 2021-05-14 MED ORDER — HYDROMORPHONE HCL 1 MG/ML IJ SOLN
0.5000 mg | Freq: Once | INTRAMUSCULAR | Status: AC
  Administered 2021-05-14: 0.5 mg via INTRAVENOUS
  Filled 2021-05-14: qty 0.5

## 2021-05-14 MED ORDER — ACETAMINOPHEN 500 MG PO TABS: 1000.0000 mg | ORAL_TABLET | Freq: Once | ORAL | Status: DC

## 2021-05-14 MED ORDER — AMISULPRIDE (ANTIEMETIC) 5 MG/2ML IV SOLN
INTRAVENOUS | Status: AC
  Filled 2021-05-14: qty 4

## 2021-05-14 MED ORDER — PHENYLEPHRINE 80 MCG/ML (10ML) SYRINGE FOR IV PUSH (FOR BLOOD PRESSURE SUPPORT)
PREFILLED_SYRINGE | INTRAVENOUS | Status: AC
  Filled 2021-05-14: qty 10

## 2021-05-14 MED ORDER — ENSURE ENLIVE PO LIQD: 237.0000 mL | Freq: Two times a day (BID) | ORAL | Status: DC

## 2021-05-14 MED ORDER — FENTANYL CITRATE (PF) 100 MCG/2ML IJ SOLN
INTRAMUSCULAR | Status: AC
  Filled 2021-05-14: qty 2

## 2021-05-14 MED ORDER — SCOPOLAMINE 1 MG/3DAYS TD PT72
MEDICATED_PATCH | TRANSDERMAL | Status: AC
  Filled 2021-05-14: qty 1

## 2021-05-14 MED ORDER — DEXAMETHASONE SODIUM PHOSPHATE 4 MG/ML IJ SOLN
INTRAMUSCULAR | Status: DC | PRN
  Administered 2021-05-14: 5 mg via INTRAVENOUS

## 2021-05-14 MED ORDER — SUGAMMADEX SODIUM 200 MG/2ML IV SOLN
INTRAVENOUS | Status: DC | PRN
Start: 2021-05-14 — End: 2021-05-14
  Administered 2021-05-14: 200 mg via INTRAVENOUS

## 2021-05-14 MED ORDER — BUPIVACAINE-EPINEPHRINE 0.5% -1:200000 IJ SOLN
INTRAMUSCULAR | Status: DC | PRN
  Administered 2021-05-14: 30 mL

## 2021-05-14 MED ORDER — HYDROMORPHONE HCL 1 MG/ML IJ SOLN
INTRAMUSCULAR | Status: AC
  Filled 2021-05-14: qty 2

## 2021-05-14 MED ORDER — OXYCODONE HCL 5 MG PO TABS
5.0000 mg | ORAL_TABLET | ORAL | Status: DC | PRN
  Administered 2021-05-14 – 2021-05-15 (×3): 10 mg via ORAL
  Filled 2021-05-14 (×3): qty 2

## 2021-05-14 MED ORDER — ROCURONIUM BROMIDE 10 MG/ML (PF) SYRINGE
PREFILLED_SYRINGE | INTRAVENOUS | Status: DC | PRN
Start: 2021-05-14 — End: 2021-05-14
  Administered 2021-05-14: 50 mg via INTRAVENOUS
  Administered 2021-05-14: 25 mg via INTRAVENOUS

## 2021-05-14 MED ORDER — PROPOFOL 10 MG/ML IV BOLUS
INTRAVENOUS | Status: AC
  Filled 2021-05-14: qty 20

## 2021-05-14 MED ORDER — MIDAZOLAM HCL 2 MG/2ML IJ SOLN
INTRAMUSCULAR | Status: AC
Start: 2021-05-14 — End: ?
  Filled 2021-05-14: qty 2

## 2021-05-14 MED ORDER — ONDANSETRON HCL 4 MG/2ML IJ SOLN
INTRAMUSCULAR | Status: DC | PRN
  Administered 2021-05-14: 4 mg via INTRAVENOUS

## 2021-05-14 MED ORDER — BUPIVACAINE-EPINEPHRINE (PF) 0.25% -1:200000 IJ SOLN
INTRAMUSCULAR | Status: AC
  Filled 2021-05-14: qty 30

## 2021-05-14 MED ORDER — AMISULPRIDE (ANTIEMETIC) 5 MG/2ML IV SOLN
10.0000 mg | Freq: Once | INTRAVENOUS | Status: AC | PRN
  Administered 2021-05-14: 10 mg via INTRAVENOUS

## 2021-05-14 MED ORDER — 0.9 % SODIUM CHLORIDE (POUR BTL) OPTIME
TOPICAL | Status: DC | PRN
  Administered 2021-05-14: 1000 mL

## 2021-05-14 MED ORDER — FENTANYL CITRATE (PF) 100 MCG/2ML IJ SOLN
INTRAMUSCULAR | Status: DC | PRN
  Administered 2021-05-14 (×4): 50 ug via INTRAVENOUS

## 2021-05-14 MED ORDER — IOHEXOL 300 MG/ML  SOLN
INTRAMUSCULAR | Status: DC | PRN
  Administered 2021-05-14: 5.5 mL

## 2021-05-14 MED ORDER — HYDROMORPHONE HCL 1 MG/ML IJ SOLN
0.2500 mg | INTRAMUSCULAR | Status: DC | PRN
  Administered 2021-05-14 (×2): 0.5 mg via INTRAVENOUS

## 2021-05-14 MED ORDER — PROPOFOL 10 MG/ML IV BOLUS
INTRAVENOUS | Status: DC | PRN
  Administered 2021-05-14: 140 mg via INTRAVENOUS

## 2021-05-14 MED ORDER — PHENYLEPHRINE 80 MCG/ML (10ML) SYRINGE FOR IV PUSH (FOR BLOOD PRESSURE SUPPORT)
PREFILLED_SYRINGE | INTRAVENOUS | Status: DC | PRN
  Administered 2021-05-14 (×3): 120 ug via INTRAVENOUS

## 2021-05-14 MED ORDER — ACETAMINOPHEN 10 MG/ML IV SOLN
INTRAVENOUS | Status: AC
  Filled 2021-05-14: qty 100

## 2021-05-14 SURGICAL SUPPLY — 41 items
APPLIER CLIP ROT 10 11.4 M/L (STAPLE) ×2
BAG COUNTER SPONGE SURGICOUNT (BAG) IMPLANT
BAG RETRIEVAL 10 (BASKET) ×1
CABLE HIGH FREQUENCY MONO STRZ (ELECTRODE) ×2 IMPLANT
CHLORAPREP W/TINT 26 (MISCELLANEOUS) ×4 IMPLANT
CLIP APPLIE ROT 10 11.4 M/L (STAPLE) ×1 IMPLANT
COVER MAYO STAND XLG (MISCELLANEOUS) ×2 IMPLANT
COVER SURGICAL LIGHT HANDLE (MISCELLANEOUS) ×2 IMPLANT
DERMABOND ADVANCED (GAUZE/BANDAGES/DRESSINGS)
DERMABOND ADVANCED .7 DNX12 (GAUZE/BANDAGES/DRESSINGS) IMPLANT
DRAPE C-ARM 42X120 X-RAY (DRAPES) ×2 IMPLANT
ELECT REM PT RETURN 15FT ADLT (MISCELLANEOUS) ×2 IMPLANT
GAUZE SPONGE 2X2 8PLY STRL LF (GAUZE/BANDAGES/DRESSINGS) ×1 IMPLANT
GLOVE SURG ORTHO 8.0 STRL STRW (GLOVE) ×2 IMPLANT
GLOVE SURG SYN 7.5  E (GLOVE) ×4
GLOVE SURG SYN 7.5 E (GLOVE) ×2 IMPLANT
GLOVE SURG SYN 7.5 PF PI (GLOVE) ×2 IMPLANT
GOWN STRL REUS W/ TWL XL LVL3 (GOWN DISPOSABLE) ×2 IMPLANT
GOWN STRL REUS W/TWL XL LVL3 (GOWN DISPOSABLE) ×4
HEMOSTAT SURGICEL 4X8 (HEMOSTASIS) IMPLANT
IRRIG SUCT STRYKERFLOW 2 WTIP (MISCELLANEOUS) ×2
IRRIGATION SUCT STRKRFLW 2 WTP (MISCELLANEOUS) ×1 IMPLANT
KIT BASIN OR (CUSTOM PROCEDURE TRAY) ×2 IMPLANT
KIT TURNOVER KIT A (KITS) IMPLANT
PENCIL SMOKE EVACUATOR (MISCELLANEOUS) IMPLANT
SCISSORS LAP 5X35 DISP (ENDOMECHANICALS) ×2 IMPLANT
SET CHOLANGIOGRAPH MIX (MISCELLANEOUS) ×2 IMPLANT
SET TUBE SMOKE EVAC HIGH FLOW (TUBING) IMPLANT
SLEEVE XCEL OPT CAN 5 100 (ENDOMECHANICALS) ×2 IMPLANT
SPIKE FLUID TRANSFER (MISCELLANEOUS) ×2 IMPLANT
SPONGE GAUZE 2X2 STER 10/PKG (GAUZE/BANDAGES/DRESSINGS) ×1
STRIP CLOSURE SKIN 1/2X4 (GAUZE/BANDAGES/DRESSINGS) IMPLANT
SUT MNCRL AB 4-0 PS2 18 (SUTURE) ×3 IMPLANT
SYS BAG RETRIEVAL 10MM (BASKET) ×1
SYSTEM BAG RETRIEVAL 10MM (BASKET) ×1 IMPLANT
TOWEL OR 17X26 10 PK STRL BLUE (TOWEL DISPOSABLE) ×2 IMPLANT
TOWEL OR NON WOVEN STRL DISP B (DISPOSABLE) ×2 IMPLANT
TRAY LAPAROSCOPIC (CUSTOM PROCEDURE TRAY) ×2 IMPLANT
TROCAR BLADELESS OPT 5 100 (ENDOMECHANICALS) ×2 IMPLANT
TROCAR XCEL BLUNT TIP 100MML (ENDOMECHANICALS) ×2 IMPLANT
TROCAR XCEL NON-BLD 11X100MML (ENDOMECHANICALS) ×2 IMPLANT

## 2021-05-14 NOTE — Anesthesia Procedure Notes (Signed)
Procedure Name: Intubation ?Date/Time: 05/14/2021 12:00 PM ?Performed by: Claudia Desanctis, CRNA ?Pre-anesthesia Checklist: Patient identified, Emergency Drugs available, Suction available and Patient being monitored ?Patient Re-evaluated:Patient Re-evaluated prior to induction ?Oxygen Delivery Method: Circle system utilized ?Preoxygenation: Pre-oxygenation with 100% oxygen ?Induction Type: IV induction ?Ventilation: Mask ventilation without difficulty ?Laryngoscope Size: 2 and Miller ?Grade View: Grade I ?Tube type: Oral ?Tube size: 7.0 mm ?Number of attempts: 1 ?Airway Equipment and Method: Stylet ?Placement Confirmation: ETT inserted through vocal cords under direct vision, positive ETCO2 and breath sounds checked- equal and bilateral ?Secured at: 21 cm ?Tube secured with: Tape ?Dental Injury: Teeth and Oropharynx as per pre-operative assessment  ? ? ? ? ?

## 2021-05-14 NOTE — Anesthesia Preprocedure Evaluation (Addendum)
Anesthesia Evaluation  ?Patient identified by MRN, date of birth, ID band ?Patient awake ? ? ? ?Reviewed: ?Allergy & Precautions, NPO status , Patient's Chart, lab work & pertinent test results ? ?Airway ?Mallampati: I ? ?TM Distance: >3 FB ?Neck ROM: Full ? ? ? Dental ?no notable dental hx. ?(+) Dental Advisory Given, Teeth Intact ?  ?Pulmonary ?neg pulmonary ROS,  ?  ?Pulmonary exam normal ?breath sounds clear to auscultation ? ? ? ? ? ? Cardiovascular ?negative cardio ROS ?Normal cardiovascular exam ?Rhythm:Regular Rate:Normal ? ? ?  ?Neuro/Psych ? Headaches, PSYCHIATRIC DISORDERS Anxiety Depression   ? GI/Hepatic ?Neg liver ROS, GERD  ,  ?Endo/Other  ?Hyperthyroidism  ? Renal/GU ?negative Renal ROS  ? ?  ?Musculoskeletal ?negative musculoskeletal ROS ?(+)  ? Abdominal ?  ?Peds ? Hematology ?negative hematology ROS ?(+)   ?Anesthesia Other Findings ? ? Reproductive/Obstetrics ?negative OB ROS ? ?  ? ? ? ? ? ? ? ? ? ? ? ? ? ?  ?  ? ? ? ? ? ? ?Anesthesia Physical ?Anesthesia Plan ? ?ASA: 2 ? ?Anesthesia Plan: General  ? ?Post-op Pain Management: Tylenol PO (pre-op)* and Celebrex PO (pre-op)*  ? ?Induction: Intravenous ? ?PONV Risk Score and Plan: 4 or greater and Ondansetron, Treatment may vary due to age or medical condition, Midazolam and Dexamethasone ? ?Airway Management Planned: Oral ETT ? ?Additional Equipment: None ? ?Intra-op Plan:  ? ?Post-operative Plan: Extubation in OR ? ?Informed Consent: I have reviewed the patients History and Physical, chart, labs and discussed the procedure including the risks, benefits and alternatives for the proposed anesthesia with the patient or authorized representative who has indicated his/her understanding and acceptance.  ? ? ? ?Dental advisory given ? ?Plan Discussed with: CRNA ? ?Anesthesia Plan Comments:   ? ? ? ? ? ?Anesthesia Quick Evaluation ? ?

## 2021-05-14 NOTE — Transfer of Care (Signed)
Immediate Anesthesia Transfer of Care Note ? ?Patient: Carrie Jensen ? ?Procedure(s) Performed: LAPAROSCOPIC CHOLECYSTECTOMY WITH INTRAOPERATIVE CHOLANGIOGRAM (Abdomen) ? ?Patient Location: PACU ? ?Anesthesia Type:General ? ?Level of Consciousness: awake, alert , oriented and patient cooperative ? ?Airway & Oxygen Therapy: Patient Spontanous Breathing and Patient connected to face mask ? ?Post-op Assessment: Report given to RN and Post -op Vital signs reviewed and stable ? ?Post vital signs: Reviewed and stable ? ?Last Vitals:  ?Vitals Value Taken Time  ?BP    ?Temp    ?Pulse    ?Resp    ?SpO2    ? ? ?Last Pain:  ?Vitals:  ? 05/14/21 1121  ?TempSrc: Oral  ?PainSc:   ?   ? ?Patients Stated Pain Goal: 3 (05/14/21 4496) ? ?Complications: No notable events documented. ?

## 2021-05-14 NOTE — Op Note (Signed)
Procedure Note ? ?Pre-operative Diagnosis:  acute cholecystitis, cholelithiasis ? ?Post-operative Diagnosis:  same ? ?Surgeon:  Armandina Gemma, MD ? ?Assistant:  Alferd Apa, PA-C  ? ?Procedure:  Laparoscopic cholecystectomy with intra-operative cholangiography ? ?Anesthesia:  General ? ?Estimated Blood Loss:  minimal ? ?Drains: none ?        ?Specimen: gallbladder to pathology ? ?Indications: Patient is a 51 year old female seen in consultation for abdominal pain.  CT scan demonstrated cholelithiasis.  Nuclear medicine hepatobiliary scan failed to visualize the gallbladder consistent with acute cholecystitis.  Patient is now prepared and brought to the operating room for cholecystectomy. ? ?Procedure description: The patient was seen in the pre-op holding area. The risks, benefits, complications, treatment options, and expected outcomes were previously discussed with the patient. The patient agreed with the proposed plan and has signed the informed consent form. ? ?The patient was transported to operating room #4 at the [facility]. The patient was placed in the supine position on the operating room table. Following induction of general anesthesia, the abdomen was prepped and draped in the usual aseptic fashion. ? ?An incision was made in the skin near the umbilicus. The midline fascia was incised and the peritoneal cavity was entered and a Hasson cannula was introduced under direct vision. The cannula was secured with a 0-Vicryl pursestring suture. Pneumoperitoneum was established with carbon dioxide. Additional cannulae were introduced under direct vision along the right costal margin in the midline, mid-clavicular line, and anterior axillary line. ?  ?The gallbladder was identified and the fundus grasped and retracted cephalad. Adhesions were taken down bluntly and the electrocautery was utilized as needed, taking care not to involve any adjacent structures. The infundibulum was grasped and retracted  laterally, exposing the peritoneum overlying the triangle of Calot. The peritoneum was incised and structures exposed with blunt dissection. The cystic duct was clearly identified, bluntly dissected circumferentially, and clipped at the neck of the gallbladder. ? ?An incision was made in the cystic duct and the cholangiogram catheter introduced. The catheter was secured using an ligaclip.  Real-time cholangiography was performed using C-arm fluoroscopy.  There was rapid filling of a normal caliber common bile duct.  There was reflux of contrast into the left and right hepatic ductal systems.  There was free flow distally into the duodenum without filling defect or obstruction.  The catheter was removed from the peritoneal cavity. ? ?The cystic duct was then ligated with ligaclips and divided. The cystic artery was identified, dissected circumferentially, ligated with ligaclips, and divided. ? ?The gallbladder was dissected away from the gallbladder bed using the electrocautery for hemostasis. The gallbladder was completely removed from the liver and placed into an endocatch bag. The gallbladder was removed in the endocatch bag through the umbilical port site and submitted to pathology for review. ? ?On the anterior surface of the right lobe of the liver was a slightly raised lobulated area consistent with the known underlying hepatic cyst.  Photographs are taken and incorporated into the medical record. ? ?The right upper quadrant was irrigated and the gallbladder bed was inspected. Hemostasis was achieved with the electrocautery. ? ?Cannulae were removed under direct vision and good hemostasis was noted. Pneumoperitoneum was released and the majority of the carbon dioxide evacuated. The umbilical wound was irrigated and the fascia was then closed with the pursestring suture.  Local anesthetic was infiltrated at all port sites. Skin incisions were closed with 4-0 Monocril subcuticular sutures and Dermabond was  applied. ? ?Instrument, sponge, and  needle counts were correct at the conclusion of the case.  The patient was awakened from anesthesia and brought to the recovery room in stable condition.  The patient tolerated the procedure well. ? ? ?Armandina Gemma, MD ?[facility] ?Office: [phone] ?  ?

## 2021-05-14 NOTE — Progress Notes (Signed)
?PROGRESS NOTE ? ?Carrie Jensen  ZTI:[id number] DOB: 1970-09-28 DOA: 05/11/2021 ?PCP: Pcp, No  ? ?Brief Narrative: ? ?Patient is a 51 year old female with history of anxiety, depression, cervical cancer, endometriosis, GERD, hypothyroidism, migraine headache who presented with abdominal pain, nausea, vomiting after colonoscopy.  She had undergone colonoscopy on 4/21 after that she developed the symptoms.  On presentation, she was hemodynamically stable.  Patient was admitted for the management of abdomen pain, nausea, vomiting.  Treated with  IV fluids, antiemetics.  Her symptoms persisted and she underwent HIDA scan which was positive for acute cholecystitis.  General surgery consulted and the plan is for cholecystectomy.  GI were also following ? ?Assessment & Plan: ? ?Principal Problem: ?  Intractable abdominal pain ?Active Problems: ?  Hyperthyroidism ?  Hyperbilirubinemia ?  Hypokalemia ?  Cholelithiasis ?  Intractable nausea and vomiting ?  RUQ abdominal pain ? ?Intractable abdominal pain/nausea/vomiting: Symptoms started after colonoscopy.  Continue current management with IV fluids, pain management, PPI.  Symptoms are better today. ? ?Acute cholecystitis: Due to above symptoms, HIDA scan was pursued which was positive for acute cholecystitis.  General surgery also consulted and the plan is for cholecystectomy today.  She has been started on ceftriaxone by general surgery, will check with general surgery if she needs antibiotics on discharge ? ?Hyperbilirubinemia: Mild.  Stable ? ?Uterine fibroid: We recommend outpatient follow-up with GYN. ? ? ?  ? ? ?Nutrition Problem: Increased nutrient needs ?Etiology: acute illness ?  ? ?DVT prophylaxis:SCDs Start: 05/11/21 1603 ? ? ?  Code Status: Full Code ? ?Family Communication: family at bedside on 4/27 ? ?Patient status:Inpatient ? ?Patient is from :Home ? ?Anticipated discharge AS:NKNL ? ?Estimated DC date:tomorrow ? ? ?Consultants:  GI ? ?Procedures:None ? ?Antimicrobials:  ?Anti-infectives (From admission, onward)  ? ? Start     Dose/Rate Route Frequency Ordered Stop  ? 05/13/21 1500  cefTRIAXone (ROCEPHIN) 2 g in sodium chloride 0.9 % 100 mL IVPB       ? 2 g ?200 mL/hr over 30 Minutes Intravenous Daily 05/13/21 1409    ? ?  ? ? ?Subjective: ? ?Patient seen and examined at the bedside this morning.  Hemodynamically stable.  She feels better today.  Has some nausea and abdominal pain but not worsening. ? ?Objective: ?Vitals:  ? 05/13/21 0605 05/13/21 1406 05/13/21 2046 05/14/21 0536  ?BP: 119/80 133/78 117/89 132/79  ?Pulse: 66 71 66 63  ?Resp: '18 18 18 18  '$ ?Temp: 98.1 ?F (36.7 ?C) 99.2 ?F (37.3 ?C) 98.2 ?F (36.8 ?C) 97.8 ?F (36.6 ?C)  ?TempSrc: Oral Oral    ?SpO2: 99% 100% 98% 100%  ?Weight:      ?Height:      ? ? ?Intake/Output Summary (Last 24 hours) at 05/14/2021 1025 ?Last data filed at 05/14/2021 1000 ?Gross per 24 hour  ?Intake 1429.39 ml  ?Output 1600 ml  ?Net -170.61 ml  ? ?Filed Weights  ? 05/11/21 0726 05/11/21 1726  ?Weight: 70.3 kg 62 kg  ? ? ?Examination: ? ?General exam: Overall comfortable, not in distress ?HEENT: PERRL ?Respiratory system:  no wheezes or crackles  ?Cardiovascular system: S1 & S2 heard, RRR.  ?Gastrointestinal system: Abdomen is nondistended, soft and has generalized tenderness on deep palpation ?Central nervous system: Alert and oriented ?Extremities: No edema, no clubbing ,no cyanosis ?Skin: No rashes, no ulcers,no icterus   ? ? ?Data Reviewed: I have personally reviewed following labs and imaging studies ? ?CBC: ?Recent Labs  ?Lab 05/09/21 ?1020 05/11/21 ?  0745 05/12/21 ?8242 05/14/21 ?0403  ?WBC 7.5 5.5 7.3 6.5  ?HGB 16.4* 15.7* 15.1* 15.4*  ?HCT 46.3* 43.7 41.7 43.8  ?MCV 93.9 91.6 92.3 92.6  ?PLT 322 342 312 308  ? ?Basic Metabolic Panel: ?Recent Labs  ?Lab 05/09/21 ?1020 05/11/21 ?0745 05/12/21 ?3536 05/13/21 ?1443 05/14/21 ?0403  ?NA 138 138 136 139 138  ?K 3.8 3.3* 3.7 3.4* 3.6  ?CL 105 106 104 106 104   ?CO2 25 19* '22 26 23  '$ ?GLUCOSE 110* 97 103* 90 90  ?BUN '15 19 14 11 8  '$ ?CREATININE 0.68 0.56 0.50 0.62 0.56  ?CALCIUM 9.5 9.5 9.7 8.8* 9.4  ? ? ? ?Recent Results (from the past 240 hour(s))  ?Urine Culture     Status: Abnormal  ? Collection Time: 05/09/21 10:22 AM  ? Specimen: Urine, Clean Catch  ?Result Value Ref Range Status  ? Specimen Description   Final  ?  URINE, CLEAN CATCH ?Performed at [facility], [place] [address], [place], [place] [address] ?  ? Special Requests   Final  ?  NONE ?Performed at [facility], [place] [address] [place], [place] [address] ?  ? Culture MULTIPLE SPECIES PRESENT, SUGGEST RECOLLECTION (A)  Final  ? Report Status 05/11/2021 FINAL  Final  ?Surgical PCR screen     Status: None  ? Collection Time: 05/13/21 11:16 PM  ? Specimen: Nasal Mucosa; Nasal Swab  ?Result Value Ref Range Status  ? MRSA, PCR NEGATIVE NEGATIVE Final  ? Staphylococcus aureus NEGATIVE NEGATIVE Final  ?  Comment: (NOTE) ?The Xpert SA Assay (FDA approved for NASAL specimens in patients 58 ?years of age and older), is one component of a comprehensive ?surveillance program. It is not intended to diagnose infection nor to ?guide or monitor treatment. ?Performed at [facility], [address] ?[place], [place] [address] ?  ?  ? ?Radiology Studies: ?NM Hepato W/EF ? ?Result Date: 05/13/2021 ?CLINICAL DATA:  Nausea vomiting and right upper quadrant abdominal pain. EXAM: NUCLEAR MEDICINE HEPATOBILIARY IMAGING TECHNIQUE: Sequential images of the abdomen were obtained out to 60 minutes following intravenous administration of radiopharmaceutical. 2 mg of morphine was administered with 30 minutes of subsequent sequential imaging RADIOPHARMACEUTICALS:  5.4 mCi Tc-71m Choletec IV COMPARISON:  CT abdomen and pelvis May 09, 2021. FINDINGS: Prompt uptake and biliary excretion of activity by the liver is seen. Large photopenic defect in the liver corresponds with the cyst seen  on prior CT. Gallbladder activity is not visualized. Biliary activity passes into small bowel, consistent with patent common bile duct. IMPRESSION: Scintigraphic findings suggestive of acute cholecystitis. Electronically Signed   By: JDahlia BailiffM.D.   On: 05/13/2021 11:59   ? ?Scheduled Meds: ? metoCLOPramide (REGLAN) injection  10 mg Intravenous Q8H  ? pantoprazole (PROTONIX) IV  40 mg Intravenous Q24H  ? ?Continuous Infusions: ? cefTRIAXone (ROCEPHIN)  IV 2 g (05/14/21 0931)  ? lactated ringers 100 mL/hr at 05/14/21 02671 ? promethazine (PHENERGAN) injection (IM or IVPB) 12.5 mg (05/12/21 1858)  ? ? ? LOS: 2 days  ? ?AShelly Coss MD ?[facility] ?P4/28/2023, 10:25 AM   ?

## 2021-05-15 DIAGNOSIS — R109 Unspecified abdominal pain: Secondary | ICD-10-CM | POA: Diagnosis not present

## 2021-05-15 MED ORDER — POLYETHYLENE GLYCOL 3350 17 G PO PACK: 17.0000 g | PACK | Freq: Every day | ORAL | 0 refills | Status: DC | PRN

## 2021-05-15 MED ORDER — HYDROMORPHONE HCL 1 MG/ML IJ SOLN
0.5000 mg | Freq: Once | INTRAMUSCULAR | Status: AC
  Administered 2021-05-15: 0.5 mg via INTRAVENOUS
  Filled 2021-05-15: qty 0.5

## 2021-05-15 MED ORDER — OXYCODONE HCL 5 MG PO TABS
5.0000 mg | ORAL_TABLET | Freq: Four times a day (QID) | ORAL | 0 refills | Status: AC | PRN
Start: 2021-05-15 — End: 2021-05-18

## 2021-05-15 NOTE — Discharge Summary (Signed)
?Discharge Summary ? ?Carrie Jensen HEN:[id number] DOB: 1970/07/24 ? ?PCP: Pcp, No ? ?Admit date: 05/11/2021 ?Discharge date: 05/15/2021 ? ?Time spent: 35 minutes. ? ?Recommendations for Outpatient Follow-up:  ?Follow-up with general surgery in 1 to 2 weeks. ?Follow-up with your primary care provider within a week. ? ?Discharge Diagnoses:  ?Active Hospital Problems  ? Diagnosis Date Noted  ? Intractable abdominal pain 05/11/2021  ? RUQ abdominal pain   ? Intractable nausea and vomiting   ? Hyperbilirubinemia 05/11/2021  ? Hypokalemia 05/11/2021  ? Cholelithiasis 05/11/2021  ? Hyperthyroidism   ?  ?Resolved Hospital Problems  ?No resolved problems to display.  ? ? ?Discharge Condition: Stable ? ? ? ?Vitals:  ? 05/15/21 0624 05/15/21 0939  ?BP: 118/76 122/70  ?Pulse: 72 80  ?Resp: 16 14  ?Temp: 98.1 ?F (36.7 ?C) 98.8 ?F (37.1 ?C)  ?SpO2: 100% 100%  ? ? ?History of present illness:  ?Patient is a 51 year old female with history of anxiety, depression, cervical cancer, endometriosis, GERD, hypothyroidism, migraine headache who presented with abdominal pain, nausea, vomiting after colonoscopy.  She had undergone colonoscopy on 4/21 after that she developed the symptoms.  On presentation, she was hemodynamically stable.  Patient was admitted for the management of abdomen pain, nausea, vomiting.  Treated with IV fluids and antiemetics.  Due to persistent symptomatology, she underwent HIDA scan which revealed acute cholecystitis.  Post lap chole by general surgery on 05/06/2021. ? ?05/15/2021: Patient was seen and examined at bedside.  Endorses some pain in her abdomen improved with analgesics, along with constipation.  Per general surgery okay to discharge today. ? ?Hospital Course:  ?Principal Problem: ?  Intractable abdominal pain ?Active Problems: ?  Hyperthyroidism ?  Hyperbilirubinemia ?  Hypokalemia ?  Cholelithiasis ?  Intractable nausea and vomiting ?  RUQ abdominal pain ? ?Resolved intractable abdominal  pain/nausea/vomiting post lap chole on 05/14/2021 by Dr. Harlow Asa:  ?Continue as needed pain control and bowel regimen ?  ?Acute cholecystitis, POA:  ?Due to persistent symptomatology HIDA scan was pursued which was positive for acute cholecystitis.  Post lap chole as stated above. ?  ?Hyperbilirubinemia:  ?Nonspecific ?TVergia Alcon Ruben 1.5. ?  ?Uterine fibroid:  ?Recommend outpatient follow-up with gynecology. ?  ?  ?  Code Status: Full Code ?  ? ?Consultants: GI ?  ?Procedures:None ? ?Discharge Exam: ?BP 122/70 (BP Location: Right Arm)   Pulse 80   Temp 98.8 ?F (37.1 ?C) (Oral)   Resp 14   Ht '5\' 2"'$  (1.575 m)   Wt 62 kg   LMP  (LMP Unknown) Comment: takes birth control cont  SpO2 100%   BMI 25.00 kg/m?  ?General: 51 y.o. year-old female well developed well nourished in no acute distress.  Alert and oriented x3. ?Cardiovascular: Regular rate and rhythm with no rubs or gallops.  No thyromegaly or JVD noted.   ?Respiratory: Clear to auscultation with no wheezes or rales. Good inspiratory effort. ?Abdomen: Soft nontender nondistended with normal bowel sounds.  Areas of lap Chole incisions appeared clean without drainage. ?Musculoskeletal: No lower extremity edema. 2/4 pulses in all 4 extremities. ?Skin: No ulcerative lesions noted or rashes, ?Psychiatry: Mood is appropriate for condition and setting ? ?Discharge Instructions ?You were cared for by a hospitalist during your hospital stay. If you have any questions about your discharge medications or the care you received while you were in the hospital after you are discharged, you can call the unit and asked to speak with the hospitalist on call if  the hospitalist that took care of you is not available. Once you are discharged, your primary care physician will handle any further medical issues. Please note that NO REFILLS for any discharge medications will be authorized once you are discharged, as it is imperative that you return to your primary care physician (or  establish a relationship with a primary care physician if you do not have one) for your aftercare needs so that they can reassess your need for medications and monitor your lab values. ? ? ?Allergies as of 05/15/2021   ? ?   Reactions  ? Erythromycin Nausea And Vomiting  ? Other Nausea And Vomiting, Other (See Comments)  ? Possible reaction to Anesthesia (afterwards) = "Cold/hot feeling, cannot keep down food, N/V, and stomach pain"  ? ?  ? ?  ?Medication List  ?  ? ?STOP taking these medications   ? ?ibuprofen 200 MG tablet ?Commonly known as: ADVIL ?  ?norethindrone-ethinyl estradiol 1-20 MG-MCG tablet ?Commonly known as: Junel 1/20 ?  ?promethazine 25 MG tablet ?Commonly known as: PHENERGAN ?  ? ?  ? ?TAKE these medications   ? ?ALPRAZolam 0.25 MG tablet ?Commonly known as: Duanne Moron ?TAKE ONE TABLET BY MOUTH EVERY NIGHT AT BEDTIME AS NEEDED FOR ANXIETY OR SLEEP ?What changed: See the new instructions. ?  ?CALCIUM PO ?Take 2 tablets by mouth at bedtime. ?  ?FISH OIL PO ?Take 1 capsule by mouth at bedtime. ?  ?[place] OP ?Place 1 drop into both eyes every morning. ?  ?Melatonin 10 MG Tabs ?Take 10 mg by mouth at bedtime. ?  ?omeprazole 20 MG tablet ?Commonly known as: PRILOSEC OTC ?Take 20 mg by mouth at bedtime. ?  ?ondansetron 4 MG disintegrating tablet ?Commonly known as: ZOFRAN-ODT ?Take 1 tablet (4 mg total) by mouth every 8 (eight) hours as needed for nausea or vomiting. ?What changed:  ?when to take this ?reasons to take this ?  ?oxyCODONE 5 MG immediate release tablet ?Commonly known as: Oxy IR/ROXICODONE ?Take 1 tablet (5 mg total) by mouth every 6 (six) hours as needed for up to 3 days for moderate pain or severe pain. ?  ?polyethylene glycol 17 g packet ?Commonly known as: MiraLax ?Take 17 g by mouth daily as needed for mild constipation. ?  ?PROBIOTIC-10 PO ?Take 1 tablet by mouth at bedtime. ?  ?SUMAtriptan 50 MG tablet ?Commonly known as: IMITREX ?Take 1 tablet (50 mg total) by mouth every 2 (two) hours  as needed for migraine. May repeat in 2 hours if headache persists or recurs. ?What changed:  ?when to take this ?additional instructions ?  ?Trulance 3 MG Tabs ?Generic drug: Plecanatide ?Take 3 mg by mouth every morning. ?  ?venlafaxine XR 150 MG 24 hr capsule ?Commonly known as: EFFEXOR-XR ?TAKE ONE CAPSULE BY MOUTH ONCE DAILY WITH BREAKFAST ?What changed:  ?how much to take ?when to take this ?additional instructions ?  ?VITAMIN B-12 PO ?Take 1 tablet by mouth every morning. ?  ?vitamin C 500 MG tablet ?Commonly known as: ASCORBIC ACID ?Take 500 mg by mouth at bedtime. ?  ?VITAMIN D3 PO ?Take 1 capsule by mouth at bedtime. ?  ?ZINC PO ?Take 1 tablet by mouth at bedtime. ?  ? ?  ? ?Allergies  ?Allergen Reactions  ? Erythromycin Nausea And Vomiting  ? Other Nausea And Vomiting and Other (See Comments)  ?  Possible reaction to Anesthesia (afterwards) = "Cold/hot feeling, cannot keep down food, N/V, and stomach pain"  ? ? Follow-up  Information   ? ? Armandina Gemma, MD. Call today.   ?Specialty: General Surgery ?Why: Please call for a posthospital follow-up appointment. ?Contact information: ?[address] ?[address] ?[place] [address] ?[phone] ? ? ?  ?  ? ?  ?  ? ?  ? ? ? ?The results of significant diagnostics from this hospitalization (including imaging, microbiology, ancillary and laboratory) are listed below for reference.   ? ?Significant Diagnostic Studies: ?DG Cholangiogram Operative ? ?Result Date: 05/14/2021 ?CLINICAL DATA:  Intraoperative cholangiogram during laparoscopic cholecystectomy. EXAM: INTRAOPERATIVE CHOLANGIOGRAM FLUOROSCOPY TIME:  13 seconds (4.7 mGy) COMPARISON:  Nuclear medicine HIDA scan-05/13/2021; CT abdomen pelvis-05/09/2021 FINDINGS: Intraoperative cholangiographic images of the right upper abdominal quadrant during laparoscopic cholecystectomy are provided for review. Surgical clips overlie the expected location of the gallbladder fossa. Contrast injection demonstrates selective  cannulation of the central aspect of the cystic duct. There is passage of contrast through the central aspect of the cystic duct with filling of a non dilated common bile duct. There is passage of contrast though the CBD

## 2021-05-15 NOTE — Plan of Care (Signed)
  Problem: Education: Goal: Knowledge of General Education information will improve Description: Including pain rating scale, medication(s)/side effects and non-pharmacologic comfort measures Outcome: Adequate for Discharge   Problem: Health Behavior/Discharge Planning: Goal: Ability to manage health-related needs will improve Outcome: Adequate for Discharge   Problem: Clinical Measurements: Goal: Ability to maintain clinical measurements within normal limits will improve Outcome: Adequate for Discharge Goal: Will remain free from infection Outcome: Adequate for Discharge Goal: Diagnostic test results will improve Outcome: Adequate for Discharge Goal: Respiratory complications will improve Outcome: Adequate for Discharge Goal: Cardiovascular complication will be avoided Outcome: Adequate for Discharge   Problem: Activity: Goal: Risk for activity intolerance will decrease Outcome: Adequate for Discharge   Problem: Nutrition: Goal: Adequate nutrition will be maintained Outcome: Adequate for Discharge   Problem: Coping: Goal: Level of anxiety will decrease Outcome: Adequate for Discharge   Problem: Elimination: Goal: Will not experience complications related to bowel motility Outcome: Adequate for Discharge Goal: Will not experience complications related to urinary retention Outcome: Adequate for Discharge   Problem: Pain Managment: Goal: General experience of comfort will improve Outcome: Adequate for Discharge   Problem: Safety: Goal: Ability to remain free from injury will improve Outcome: Adequate for Discharge   Problem: Skin Integrity: Goal: Risk for impaired skin integrity will decrease Outcome: Adequate for Discharge   Problem: Increased Nutrient Needs (NI-5.1) Goal: Food and/or nutrient delivery Description: Individualized approach for food/nutrient provision. Outcome: Adequate for Discharge   

## 2021-05-15 NOTE — Progress Notes (Signed)
Assessment unchanged. Pt and husband verbalized understanding via teach back including medications and follow up care. Discharged via wc to front entrance to meet husband, accompanied by NT. ?

## 2021-05-15 NOTE — Discharge Instructions (Signed)
[facility] ______[facility] LAPAROSCOPIC SURGERY: POST OP INSTRUCTIONS Always review your discharge instruction sheet given to you by the facility where your surgery was performed. IF YOU HAVE DISABILITY OR FAMILY LEAVE FORMS, YOU MUST BRING THEM TO THE OFFICE FOR PROCESSING.   DO NOT GIVE THEM TO YOUR DOCTOR.  A prescription for pain medication may be given to you upon discharge.  Take your pain medication as prescribed, if needed.  If narcotic pain medicine is not needed, then you may take acetaminophen (Tylenol) or ibuprofen (Advil) as needed. Take your usually prescribed medications unless otherwise directed. If you need a refill on your pain medication, please contact your pharmacy.  They will contact our office to request authorization. Prescriptions will not be filled after 5pm or on week-ends. You should follow a light diet the first few days after arrival home, such as soup and crackers, etc.  Be sure to include lots of fluids daily. Most patients will experience some swelling and bruising in the area of the incisions.  Ice packs will help.  Swelling and bruising can take several days to resolve.  It is common to experience some constipation if taking pain medication after surgery.  Increasing fluid intake and taking a stool softener (such as Colace) will usually help or prevent this problem from occurring.  A mild laxative (Milk of Magnesia or Miralax) should be taken according to package instructions if there are no bowel movements after 48 hours. Unless discharge instructions indicate otherwise, you may remove your bandages 24-48 hours after surgery, and you may shower at that time.  You may have steri-strips (small skin tapes) in place directly over the incision.  These strips should be left on the skin for 7-10 days.  If your surgeon used skin glue on the incision, you may shower in 24 hours.  The glue will flake off over the next 2-3 weeks.  Any sutures or staples will be  removed at the office during your follow-up visit. ACTIVITIES:  You may resume regular (light) daily activities beginning the next day--such as daily self-care, walking, climbing stairs--gradually increasing activities as tolerated.  You may have sexual intercourse when it is comfortable.  Refrain from any heavy lifting or straining until approved by your doctor. You may drive when you are no longer taking prescription pain medication, you can comfortably wear a seatbelt, and you can safely maneuver your car and apply brakes. RETURN TO WORK:  __________________________________________________________ You should see your doctor in the office for a follow-up appointment approximately 2-3 weeks after your surgery.  Make sure that you call for this appointment within a day or two after you arrive home to insure a convenient appointment time. OTHER INSTRUCTIONS: __________________________________________________________________________________________________________________________ __________________________________________________________________________________________________________________________ WHEN TO CALL YOUR DOCTOR: Fever over 101.0 Inability to urinate Continued bleeding from incision. Increased pain, redness, or drainage from the incision. Increasing abdominal pain  The clinic staff is available to answer your questions during regular business hours.  Please don't hesitate to call and ask to speak to one of the nurses for clinical concerns.  If you have a medical emergency, go to the nearest emergency room or call 911.  A surgeon from [facility] is always on call at the hospital. [address], [address], [place], [place]  [address] ? [address] [address], [place], [place]   [address] [phone] ? [phone] ? FAX [phone] Web site: [email or web address]  

## 2021-05-15 NOTE — Progress Notes (Signed)
1 Day Post-Op  ? ?Subjective/Chief Complaint: ?Pt doing well sore  ? ? ?Objective: ?Vital signs in last 24 hours: ?Temp:  [98 ?F (36.7 ?C)-99.6 ?F (37.6 ?C)] 98.8 ?F (37.1 ?C) (04/29 4970) ?Pulse Rate:  [65-96] 80 (04/29 0939) ?Resp:  [14-18] 14 (04/29 0939) ?BP: (118-145)/(69-85) 122/70 (04/29 2637) ?SpO2:  [94 %-100 %] 100 % (04/29 0939) ?Last BM Date : 05/11/21 ? ?Intake/Output from previous day: ?04/28 0701 - 04/29 0700 ?In: 1500 [P.O.:300; I.V.:1000; IV Piggyback:200] ?Out: 1270 [Urine:1250; Blood:20] ?Intake/Output this shift: ?Total I/O ?In: 120 [P.O.:120] ?Out: 200 [Urine:200] ? ?Port sites CDI  ?Sore abdomen  ?Lab Results:  ?Recent Labs  ?  05/14/21 ?0403  ?WBC 6.5  ?HGB 15.4*  ?HCT 43.8  ?PLT 308  ? ?BMET ?Recent Labs  ?  05/13/21 ?0352 05/14/21 ?0403  ?NA 139 138  ?K 3.4* 3.6  ?CL 106 104  ?CO2 26 23  ?GLUCOSE 90 90  ?BUN 11 8  ?CREATININE 0.62 0.56  ?CALCIUM 8.8* 9.4  ? ?PT/INR ?No results for input(s): LABPROT, INR in the last 72 hours. ?ABG ?No results for input(s): PHART, HCO3 in the last 72 hours. ? ?Invalid input(s): PCO2, PO2 ? ?Studies/Results: ?DG Cholangiogram Operative ? ?Result Date: 05/14/2021 ?CLINICAL DATA:  Intraoperative cholangiogram during laparoscopic cholecystectomy. EXAM: INTRAOPERATIVE CHOLANGIOGRAM FLUOROSCOPY TIME:  13 seconds (4.7 mGy) COMPARISON:  Nuclear medicine HIDA scan-05/13/2021; CT abdomen pelvis-05/09/2021 FINDINGS: Intraoperative cholangiographic images of the right upper abdominal quadrant during laparoscopic cholecystectomy are provided for review. Surgical clips overlie the expected location of the gallbladder fossa. Contrast injection demonstrates selective cannulation of the central aspect of the cystic duct. There is passage of contrast through the central aspect of the cystic duct with filling of a non dilated common bile duct. There is passage of contrast though the CBD and into the descending portion of the duodenum. There is minimal reflux of injected contrast  into the common hepatic duct and central aspect of the non dilated intrahepatic biliary system. There are no discrete filling defects within the opacified portions of the biliary system to suggest the presence of choledocholithiasis. IMPRESSION: No evidence of choledocholithiasis. Electronically Signed   By: Sandi Mariscal M.D.   On: 05/14/2021 13:13  ? ?NM Hepato W/EF ? ?Result Date: 05/13/2021 ?CLINICAL DATA:  Nausea vomiting and right upper quadrant abdominal pain. EXAM: NUCLEAR MEDICINE HEPATOBILIARY IMAGING TECHNIQUE: Sequential images of the abdomen were obtained out to 60 minutes following intravenous administration of radiopharmaceutical. 2 mg of morphine was administered with 30 minutes of subsequent sequential imaging RADIOPHARMACEUTICALS:  5.4 mCi Tc-64m Choletec IV COMPARISON:  CT abdomen and pelvis May 09, 2021. FINDINGS: Prompt uptake and biliary excretion of activity by the liver is seen. Large photopenic defect in the liver corresponds with the cyst seen on prior CT. Gallbladder activity is not visualized. Biliary activity passes into small bowel, consistent with patent common bile duct. IMPRESSION: Scintigraphic findings suggestive of acute cholecystitis. Electronically Signed   By: JDahlia BailiffM.D.   On: 05/13/2021 11:59   ? ?Anti-infectives: ?Anti-infectives (From admission, onward)  ? ? Start     Dose/Rate Route Frequency Ordered Stop  ? 05/13/21 1500  cefTRIAXone (ROCEPHIN) 2 g in sodium chloride 0.9 % 100 mL IVPB  Status:  Discontinued       ? 2 g ?200 mL/hr over 30 Minutes Intravenous Daily 05/13/21 1409 05/14/21 1450  ? ?  ? ? ?Assessment/Plan: ?s/p Procedure(s): ?LAPAROSCOPIC CHOLECYSTECTOMY WITH INTRAOPERATIVE CHOLANGIOGRAM (N/A) ?Stable for D/C  ?Instructions on  chart  ?Follow up on chart  ? LOS: 3 days  ? ? ?Turner Daniels MD  ?05/15/2021 ? ?

## 2021-05-17 ENCOUNTER — Encounter (HOSPITAL_COMMUNITY): Payer: Self-pay | Admitting: Surgery

## 2021-05-17 LAB — SURGICAL PATHOLOGY

## 2021-05-27 ENCOUNTER — Ambulatory Visit: Payer: BC Managed Care – PPO | Admitting: Gastroenterology

## 2021-05-30 NOTE — Anesthesia Postprocedure Evaluation (Signed)
Anesthesia Post Note ? ?Patient: Carrie Jensen ? ?Procedure(s) Performed: LAPAROSCOPIC CHOLECYSTECTOMY WITH INTRAOPERATIVE CHOLANGIOGRAM (Abdomen) ? ?  ? ?Patient location during evaluation: PACU ?Anesthesia Type: General ?Level of consciousness: sedated and patient cooperative ?Pain management: pain level controlled ?Vital Signs Assessment: post-procedure vital signs reviewed and stable ?Respiratory status: spontaneous breathing ?Cardiovascular status: stable ?Anesthetic complications: no ? ? ?No notable events documented. ? ?Last Vitals:  ?Vitals:  ? 05/15/21 0624 05/15/21 0939  ?BP: 118/76 122/70  ?Pulse: 72 80  ?Resp: 16 14  ?Temp: 36.7 ?C 37.1 ?C  ?SpO2: 100% 100%  ?  ?Last Pain:  ?Vitals:  ? 05/15/21 1035  ?TempSrc:   ?PainSc: 6   ? ? ?  ?  ?  ?  ?  ?  ? ?Nolon Nations ? ? ? ? ?

## 2021-06-08 ENCOUNTER — Encounter: Payer: Self-pay | Admitting: Gastroenterology

## 2021-06-08 ENCOUNTER — Ambulatory Visit (INDEPENDENT_AMBULATORY_CARE_PROVIDER_SITE_OTHER): Payer: BC Managed Care – PPO | Admitting: Gastroenterology

## 2021-06-08 DIAGNOSIS — K581 Irritable bowel syndrome with constipation: Secondary | ICD-10-CM | POA: Diagnosis not present

## 2021-06-08 MED ORDER — TRULANCE 3 MG PO TABS: 3.0000 mg | ORAL_TABLET | Freq: Every morning | ORAL | 11 refills | Status: DC

## 2021-06-08 NOTE — Progress Notes (Signed)
I agree with the assessment and plan as outlined by Carrie Jensen. 

## 2021-06-08 NOTE — Progress Notes (Signed)
06/08/2021 Carrie Jensen [id number] 1970/04/28   HISTORY OF PRESENT ILLNESS: This is a 51 year old female who is a patient of Dr. Libby Maw.  She is here today for follow-up.  She had a colonoscopy in April and following that she started having abdominal pain, vomiting, etc.  CT scan did not show any sign of perforation.  She did have gallstones.  Was discharged from the ER once with Zofran.  Presented back again with ongoing complaints and was admitted to the hospital.  Was seen by our service.  HIDA scan ended up being positive and she went for cholecystectomy with Dr. Harlow Asa.  That was just shy of a month ago.  She is feeling much better.  Still with a little discomfort, but otherwise feeling much better.  Is using her Trulance daily for her constipation.  Is asking if we can continue that prescription for her.   Past Medical History:  Diagnosis Date   Anxiety    Cancer ([facility])    CIN II (cervical intraepithelial neoplasia II) 1998   LEEP   Depression    Endometriosis    GERD (gastroesophageal reflux disease)    Hyperthyroidism    DR. BALAN   IBS (irritable bowel syndrome)    Migraines    Plantar fasciitis of left foot    Past Surgical History:  Procedure Laterality Date   CERVICAL BIOPSY  W/ LOOP ELECTRODE EXCISION  01/18/1996   CIN II   CHOLECYSTECTOMY N/A 05/14/2021   Procedure: LAPAROSCOPIC CHOLECYSTECTOMY WITH INTRAOPERATIVE CHOLANGIOGRAM;  Surgeon: Armandina Gemma, MD;  Location: [facility];  Service: General;  Laterality: N/A;   COLONOSCOPY     MOHS SURGERY     OVARIAN CYSTECTOMY, PARTIAL LSO, FULGURATION OF ENDOMETRIOSIS      reports that she has never smoked. She has never used smokeless tobacco. She reports that she does not drink alcohol and does not use drugs. family history includes Cancer in her maternal aunt; Colon polyps in her brother; Diabetes in her maternal grandfather; Heart disease in her maternal grandfather, maternal grandmother, paternal grandfather,  and paternal grandmother; Hypertension in her maternal grandfather; Stroke in her maternal grandmother. Allergies  Allergen Reactions   Erythromycin Nausea And Vomiting   Other Nausea And Vomiting and Other (See Comments)    Possible reaction to Anesthesia (afterwards) = "Cold/hot feeling, cannot keep down food, N/V, and stomach pain"      Outpatient Encounter Medications as of 06/08/2021  Medication Sig   ALPRAZolam (XANAX) 0.25 MG tablet TAKE ONE TABLET BY MOUTH EVERY NIGHT AT BEDTIME AS NEEDED FOR ANXIETY OR SLEEP (Patient taking differently: Take 0.25 mg by mouth daily as needed for anxiety.)   CALCIUM PO Take 2 tablets by mouth at bedtime.   Carboxymethylcell-Hypromellose (GENTEAL OP) Place 1 drop into both eyes every morning.   Cholecalciferol (VITAMIN D3 PO) Take 1 capsule by mouth at bedtime.   Cyanocobalamin (VITAMIN B-12 PO) Take 1 tablet by mouth every morning.   Melatonin 10 MG TABS Take 10 mg by mouth at bedtime.   Multiple Vitamins-Minerals (ZINC PO) Take 1 tablet by mouth at bedtime.   Omega-3 Fatty Acids (FISH OIL PO) Take 1 capsule by mouth at bedtime.   omeprazole (PRILOSEC OTC) 20 MG tablet Take 20 mg by mouth at bedtime.   ondansetron (ZOFRAN-ODT) 4 MG disintegrating tablet Take 1 tablet (4 mg total) by mouth every 8 (eight) hours as needed for nausea or vomiting. (Patient taking differently: Take 4 mg by mouth every 6 (  six) hours as needed for nausea or vomiting (dissolve orally).)   Plecanatide (TRULANCE) 3 MG TABS Take 3 mg by mouth every morning.   polyethylene glycol (MIRALAX) 17 g packet Take 17 g by mouth daily as needed for mild constipation.   Probiotic Product (PROBIOTIC-10 PO) Take 1 tablet by mouth at bedtime.   SUMAtriptan (IMITREX) 50 MG tablet Take 1 tablet (50 mg total) by mouth every 2 (two) hours as needed for migraine. May repeat in 2 hours if headache persists or recurs. (Patient taking differently: Take 50 mg by mouth See admin instructions. Take one  tablet (50 mg) by mouth at onset of migraine headache and may repeat in 2 hours if headache persists or recurs)   venlafaxine XR (EFFEXOR-XR) 150 MG 24 hr capsule TAKE ONE CAPSULE BY MOUTH ONCE DAILY WITH BREAKFAST (Patient taking differently: 150 mg daily with breakfast.)   vitamin C (ASCORBIC ACID) 500 MG tablet Take 500 mg by mouth at bedtime.   No facility-administered encounter medications on file as of 06/08/2021.     REVIEW OF SYSTEMS  : All other systems reviewed and negative except where noted in the History of Present Illness.   PHYSICAL EXAM: BP 100/74   Pulse 83   Ht '5\' 2"'$  (1.575 m)   Wt 138 lb 3.2 oz (62.7 kg)   LMP  (LMP Unknown) Comment: takes birth control cont  SpO2 99%   BMI 25.28 kg/m  General: Well developed white female in no acute distress Head: Normocephalic and atraumatic Eyes:  Sclerae anicteric, conjunctiva pink. Ears: Normal auditory acuity Lungs: Clear throughout to auscultation; no W/R/R. Heart: Regular rate and rhythm; no M/R/G. Abdomen: Soft, non-distended.  BS present.  Non-tender. Musculoskeletal: Symmetrical with no gross deformities  Skin: No lesions on visible extremities Extremities: No edema  Neurological: Alert oriented x 4, grossly non-focal Psychological:  Alert and cooperative. Normal mood and affect  ASSESSMENT AND PLAN: *IBS with constipation: Well-controlled on Trulance 3 mg daily.  We will send prescription to pharmacy with enough refills for 1 year.  Follow-up in a year or sooner if needed.   CC:  No ref. provider found

## 2021-06-08 NOTE — Patient Instructions (Addendum)
We have sent the following medications to your pharmacy for you to pick up at your convenience: Trulance 3 mg daily.   Follow up in 1 year or sooner if needed.  Thank you for entrusting me with your care and for choosing [facility] [facility],  Alonza Bogus, P.A. - C.

## 2021-06-25 ENCOUNTER — Ambulatory Visit: Payer: BC Managed Care – PPO | Admitting: Family

## 2021-07-10 ENCOUNTER — Encounter: Payer: Self-pay | Admitting: Nurse Practitioner

## 2021-07-26 ENCOUNTER — Other Ambulatory Visit: Payer: Self-pay

## 2021-08-09 ENCOUNTER — Ambulatory Visit: Payer: BC Managed Care – PPO | Admitting: Nurse Practitioner

## 2021-08-09 ENCOUNTER — Encounter: Payer: Self-pay | Admitting: Nurse Practitioner

## 2021-08-09 VITALS — BP 98/68 | HR 78

## 2021-08-09 DIAGNOSIS — R300 Dysuria: Secondary | ICD-10-CM | POA: Diagnosis not present

## 2021-08-09 DIAGNOSIS — N760 Acute vaginitis: Secondary | ICD-10-CM | POA: Diagnosis not present

## 2021-08-09 DIAGNOSIS — N3001 Acute cystitis with hematuria: Secondary | ICD-10-CM

## 2021-08-09 MED ORDER — PHENAZOPYRIDINE HCL 200 MG PO TABS: 200.0000 mg | ORAL_TABLET | Freq: Three times a day (TID) | ORAL | 0 refills | Status: DC | PRN

## 2021-08-09 MED ORDER — FLUCONAZOLE 150 MG PO TABS: 150.0000 mg | ORAL_TABLET | ORAL | 0 refills | Status: DC

## 2021-08-09 MED ORDER — NITROFURANTOIN MONOHYD MACRO 100 MG PO CAPS: 100.0000 mg | ORAL_CAPSULE | Freq: Two times a day (BID) | ORAL | 0 refills | Status: DC

## 2021-08-09 NOTE — Progress Notes (Signed)
   Acute Office Visit  Subjective:    Patient ID: Carrie Jensen, female    DOB: 05-Jul-1970, 51 y.o.   MRN: [id number]   HPI 51 y.o. presents today for dysuria, frequency, urgency, and lower abdominal pressure that started this morning. She also has some hematuria and blood with wiping. Sexually active 3 days ago for the first time in a long time.    Review of Systems  Constitutional: Negative.   Genitourinary:  Positive for dysuria, frequency, hematuria and urgency. Negative for difficulty urinating, flank pain and vaginal bleeding.       Objective:    Physical Exam Constitutional:      Appearance: Normal appearance.  Abdominal:     Tenderness: There is no right CVA tenderness or left CVA tenderness.   GU: Not indicated  BP 98/68   Pulse 78   LMP  (LMP Unknown) Comment: takes birth control cont  SpO2 98%  Wt Readings from Last 3 Encounters:  06/08/21 138 lb 3.2 oz (62.7 kg)  05/11/21 136 lb 11 oz (62 kg)  05/09/21 154 lb 15.7 oz (70.3 kg)        Patient informed chaperone available to be present for breast and/or pelvic exam. Patient has requested no chaperone to be present. Patient has been advised what will be completed during breast and pelvic exam.   UA: 2+ leukocytes, nitrite negative, 2+ protein, 3+ blood, SG 1.015. Microscopic: wbc 40-60, rbc >60, moderate bacteria  Assessment & Plan:   Problem List Items Addressed This Visit   None Visit Diagnoses     Acute cystitis with hematuria    -  Primary   Relevant Medications   nitrofurantoin, macrocrystal-monohydrate, (MACROBID) 100 MG capsule   Dysuria       Relevant Medications   phenazopyridine (PYRIDIUM) 200 MG tablet   Other Relevant Orders   Urinalysis,Complete w/RFL Culture   Acute vaginitis       Relevant Medications   fluconazole (DIFLUCAN) 150 MG tablet      Plan: Acute cystitis - Macrobid 100 mg BID x 7 days. Culture pending. Pyridium as needed. Increase water intake. Requesting Diflucan  for yeast management.      Tamela Gammon DNP, 4:07 PM 08/09/2021

## 2021-08-12 LAB — URINALYSIS, COMPLETE W/RFL CULTURE
Bilirubin Urine: NEGATIVE
Glucose, UA: NEGATIVE
Hyaline Cast: NONE SEEN /LPF
Ketones, ur: NEGATIVE
Nitrites, Initial: NEGATIVE
RBC / HPF: >=60 /HPF — AB (ref 0–2)
Specific Gravity, Urine: 1.01 (ref 1.001–1.035)
pH: 5.5 (ref 5.0–8.0)

## 2021-08-12 LAB — URINE CULTURE
MICRO NUMBER:: 13685326
SPECIMEN QUALITY:: ADEQUATE

## 2021-08-12 LAB — CULTURE INDICATED

## 2021-10-04 ENCOUNTER — Other Ambulatory Visit: Payer: Self-pay

## 2021-10-04 DIAGNOSIS — K769 Liver disease, unspecified: Secondary | ICD-10-CM

## 2021-11-07 ENCOUNTER — Ambulatory Visit
Admission: RE | Admit: 2021-11-07 | Discharge: 2021-11-07 | Disposition: A | Discharge status: Home / Self Care | Payer: BC Managed Care – PPO | Source: Ambulatory Visit | Attending: Internal Medicine | Admitting: Internal Medicine

## 2021-11-07 DIAGNOSIS — K7689 Other specified diseases of liver: Secondary | ICD-10-CM | POA: Diagnosis not present

## 2021-11-07 DIAGNOSIS — K769 Liver disease, unspecified: Secondary | ICD-10-CM

## 2021-11-07 DIAGNOSIS — Q446 Cystic disease of liver: Secondary | ICD-10-CM | POA: Diagnosis not present

## 2021-11-07 MED ORDER — GADOPICLENOL 0.5 MMOL/ML IV SOLN
7.0000 mL | Freq: Once | INTRAVENOUS | Status: AC | PRN
  Administered 2021-11-07: 7 mL via INTRAVENOUS

## 2021-11-08 ENCOUNTER — Other Ambulatory Visit: Payer: Self-pay

## 2021-11-08 DIAGNOSIS — K769 Liver disease, unspecified: Secondary | ICD-10-CM

## 2022-03-16 ENCOUNTER — Ambulatory Visit (INDEPENDENT_AMBULATORY_CARE_PROVIDER_SITE_OTHER): Payer: BC Managed Care – PPO | Admitting: Nurse Practitioner

## 2022-03-16 ENCOUNTER — Encounter: Payer: Self-pay | Admitting: Nurse Practitioner

## 2022-03-16 ENCOUNTER — Other Ambulatory Visit (HOSPITAL_COMMUNITY)
Admission: RE | Admit: 2022-03-16 | Discharge: 2022-03-16 | Disposition: A | Discharge status: Home / Self Care | Payer: BC Managed Care – PPO | Source: Ambulatory Visit | Attending: Nurse Practitioner | Admitting: Nurse Practitioner

## 2022-03-16 VITALS — BP 122/82 | Ht 60.5 in | Wt 129.0 lb

## 2022-03-16 DIAGNOSIS — Z780 Asymptomatic menopausal state: Secondary | ICD-10-CM | POA: Diagnosis not present

## 2022-03-16 DIAGNOSIS — Z124 Encounter for screening for malignant neoplasm of cervix: Secondary | ICD-10-CM

## 2022-03-16 DIAGNOSIS — R519 Headache, unspecified: Secondary | ICD-10-CM

## 2022-03-16 DIAGNOSIS — R350 Frequency of micturition: Secondary | ICD-10-CM

## 2022-03-16 DIAGNOSIS — F418 Other specified anxiety disorders: Secondary | ICD-10-CM

## 2022-03-16 DIAGNOSIS — F419 Anxiety disorder, unspecified: Secondary | ICD-10-CM

## 2022-03-16 DIAGNOSIS — Z01419 Encounter for gynecological examination (general) (routine) without abnormal findings: Secondary | ICD-10-CM | POA: Diagnosis not present

## 2022-03-16 MED ORDER — VENLAFAXINE HCL ER 150 MG PO CP24: 150.0000 mg | ORAL_CAPSULE | Freq: Every day | ORAL | 4 refills | Status: DC

## 2022-03-16 MED ORDER — PROMETHAZINE HCL 25 MG PO TABS: 25.0000 mg | ORAL_TABLET | Freq: Four times a day (QID) | ORAL | 0 refills | Status: DC | PRN

## 2022-03-16 MED ORDER — SUMATRIPTAN SUCCINATE 50 MG PO TABS: 50.0000 mg | ORAL_TABLET | ORAL | 6 refills | Status: DC | PRN

## 2022-03-16 MED ORDER — ALPRAZOLAM 0.25 MG PO TABS: 0.2500 mg | ORAL_TABLET | Freq: Every evening | ORAL | 0 refills | Status: DC | PRN

## 2022-03-16 NOTE — Progress Notes (Signed)
Carrie Jensen 03-Jul-1970 [id number]   History:  52 y.o. G0 presents for annual exam. Postmenopausal - no HRT, no bleeding.  1998 CIN-2 LEEP, subsequent paps normal.  History of ovarian cystectomy, basal cell carcinoma, anxiety/depression, migraines, IBS, and hyperthyroidism. Xanax rarely for anxiety. Imitrex as needed for migraines, take occasionally. Complains of urinary frequency without dysuria or urgency.   Gynecologic History No LMP recorded (lmp unknown). Patient is postmenopausal.   Contraception/Family planning: post menopausal status Sexually active: Yes  Health Maintenance Last Pap: 02/12/2019. Results were: ASCUS negative HPV, 3-year repeat Last mammogram: 03/11/2021. Results were: Possible left breast mass, follow up imaging normal Last colonoscopy: 05/07/2021. Results were: Normal, 10-year recall Last Dexa: Never  Past medical history, past surgical history, family history and social history were all reviewed and documented in the EPIC chart. 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   ROS:  A ROS was performed and pertinent positives and negatives are included.  Exam:  Vitals:   03/16/22 1559  BP: 122/82  Weight: 129 lb (58.5 kg)  Height: 5' 0.5" (1.537 m)     Body mass index is 24.78 kg/m.  General appearance:  Normal Thyroid:  Symmetrical, normal in size, without palpable masses or nodularity. Respiratory  Auscultation:  Clear without wheezing or rhonchi Cardiovascular  Auscultation:  Regular rate, without rubs, murmurs or gallops  Edema/varicosities:  Not grossly evident Abdominal  Soft,nontender, without masses, guarding or rebound.  Liver/spleen:  No organomegaly noted  Hernia:  None appreciated  Skin  Inspection:  Grossly normal   Breasts: Examined lying and sitting.   Right: Without masses, retractions, discharge or axillary adenopathy.   Left: Without masses, retractions, discharge or axillary adenopathy. Genitourinary   Inguinal/mons:   Normal without inguinal adenopathy  External genitalia:  Normal appearing vulva with no masses, tenderness, or lesions  BUS/Urethra/Skene's glands:  Normal  Vagina:  Normal appearing with normal color and discharge, no lesions. Atrophic changes.   Cervix:  Normal appearing without discharge or lesions  Uterus:  Normal in size, shape and contour.  Midline and mobile, nontender  Adnexa/parametria:     Rt: Normal in size, without masses or tenderness.   Lt: Normal in size, without masses or tenderness.  Anus and perineum: Normal  Digital rectal exam: Deferred  Patient informed chaperone available to be present for breast and pelvic exam. Patient has requested no chaperone to be present. Patient has been advised what will be completed during breast and pelvic exam.   Assessment/Plan:  52 y.o. G0 for annual exam.   Well female exam with routine gynecological exam - Education provided on SBEs, importance of preventative screenings, current guidelines, high calcium diet, regular exercise, and multivitamin daily. Labs through work.  Screening for cervical cancer - Plan: Cytology - PAP( [place]). 1998 CIN-2 LEEP, subsequent paps normal.  Anxiety - Plan: ALPRAZolam (XANAX) 0.25 MG tablet as needed. Takes rarely.   Postmenopausal - no HRT, no bleeding. Occasional hot flashes and night sweats, tolerable. Discussed OTC supplements.   Generalized headaches - Plan: SUMAtriptan (IMITREX) 50 MG tablet, promethazine (PHENERGAN) 25 MG tablet. Has been taking Imitrex less frequently.   Depression with anxiety - Plan: venlafaxine XR (EFFEXOR-XR) 150 MG 24 hr capsule daily. Good management. Refill x 1 year provided.   Urinary frequency - Plan: Urinalysis,Complete w/RFL Culture  Screening for breast cancer - Normal mammogram history. Continue annual screenings.  Normal breast exam today.  Screening for colon cancer - Screening colonoscopy last year. Had severe GI symptoms following  and ended up having  gallbladder removed. Refuses further colonoscopies.   Follow-up in 1 year for annual.      Tamela Gammon [facility], 4:33 PM 03/16/2022

## 2022-03-18 LAB — URINALYSIS, COMPLETE W/RFL CULTURE
Bilirubin Urine: NEGATIVE
Casts: NONE SEEN /LPF
Crystals: NONE SEEN /HPF
Glucose, UA: NEGATIVE
Hyaline Cast: NONE SEEN /LPF
Ketones, ur: NEGATIVE
Nitrites, Initial: NEGATIVE
Protein, ur: NEGATIVE
Specific Gravity, Urine: 1.015 (ref 1.001–1.035)
Yeast: NONE SEEN /HPF
pH: 5.5 (ref 5.0–8.0)

## 2022-03-18 LAB — CULTURE INDICATED

## 2022-03-18 LAB — URINE CULTURE
MICRO NUMBER:: 14626448
SPECIMEN QUALITY:: ADEQUATE

## 2022-03-21 LAB — CYTOLOGY - PAP
Comment: NEGATIVE
Diagnosis: UNDETERMINED — AB
High risk HPV: NEGATIVE

## 2022-03-22 ENCOUNTER — Other Ambulatory Visit: Payer: Self-pay | Admitting: Nurse Practitioner

## 2022-03-22 DIAGNOSIS — Z1231 Encounter for screening mammogram for malignant neoplasm of breast: Secondary | ICD-10-CM

## 2022-03-23 DIAGNOSIS — Z1231 Encounter for screening mammogram for malignant neoplasm of breast: Secondary | ICD-10-CM

## 2022-04-04 ENCOUNTER — Encounter: Payer: Self-pay | Admitting: Nurse Practitioner

## 2022-05-14 ENCOUNTER — Other Ambulatory Visit: Payer: Self-pay | Admitting: Nurse Practitioner

## 2022-05-14 DIAGNOSIS — Z1231 Encounter for screening mammogram for malignant neoplasm of breast: Secondary | ICD-10-CM

## 2022-06-14 ENCOUNTER — Other Ambulatory Visit: Payer: Self-pay | Admitting: Nurse Practitioner

## 2022-06-14 ENCOUNTER — Other Ambulatory Visit: Payer: Self-pay | Admitting: Gastroenterology

## 2022-06-14 DIAGNOSIS — Z1231 Encounter for screening mammogram for malignant neoplasm of breast: Secondary | ICD-10-CM

## 2022-06-14 DIAGNOSIS — K581 Irritable bowel syndrome with constipation: Secondary | ICD-10-CM

## 2022-06-15 DIAGNOSIS — Z1231 Encounter for screening mammogram for malignant neoplasm of breast: Secondary | ICD-10-CM

## 2022-06-16 DIAGNOSIS — Z1231 Encounter for screening mammogram for malignant neoplasm of breast: Secondary | ICD-10-CM

## 2022-08-05 ENCOUNTER — Ambulatory Visit: Payer: BC Managed Care – PPO | Admitting: Family

## 2022-08-05 ENCOUNTER — Encounter: Payer: Self-pay | Admitting: Family

## 2022-08-05 VITALS — BP 120/68 | HR 69 | Temp 97.3°F | Resp 18 | Ht 60.5 in | Wt 126.0 lb

## 2022-08-05 DIAGNOSIS — Z0000 Encounter for general adult medical examination without abnormal findings: Secondary | ICD-10-CM

## 2022-08-05 DIAGNOSIS — K589 Irritable bowel syndrome without diarrhea: Secondary | ICD-10-CM | POA: Diagnosis not present

## 2022-08-05 DIAGNOSIS — E0590 Thyrotoxicosis, unspecified without thyrotoxic crisis or storm: Secondary | ICD-10-CM

## 2022-08-05 DIAGNOSIS — F411 Generalized anxiety disorder: Secondary | ICD-10-CM | POA: Diagnosis not present

## 2022-08-05 DIAGNOSIS — Z23 Encounter for immunization: Secondary | ICD-10-CM | POA: Diagnosis not present

## 2022-08-05 DIAGNOSIS — K2100 Gastro-esophageal reflux disease with esophagitis, without bleeding: Secondary | ICD-10-CM | POA: Diagnosis not present

## 2022-08-05 DIAGNOSIS — R3915 Urgency of urination: Secondary | ICD-10-CM

## 2022-08-05 DIAGNOSIS — G43009 Migraine without aura, not intractable, without status migrainosus: Secondary | ICD-10-CM

## 2022-08-05 DIAGNOSIS — F418 Other specified anxiety disorders: Secondary | ICD-10-CM

## 2022-08-05 MED ORDER — VENLAFAXINE HCL ER 37.5 MG PO CP24: 37.5000 mg | ORAL_CAPSULE | Freq: Every day | ORAL | 0 refills | Status: DC

## 2022-08-05 MED ORDER — VENLAFAXINE HCL 75 MG PO TABS
75.0000 mg | ORAL_TABLET | Freq: Two times a day (BID) | ORAL | 0 refills | Status: DC
Start: 2022-08-05 — End: 2022-08-05

## 2022-08-05 MED ORDER — VENLAFAXINE HCL 75 MG PO TABS
75.0000 mg | ORAL_TABLET | Freq: Every day | ORAL | 0 refills | Status: DC
Start: 2022-08-05 — End: 2022-09-05

## 2022-08-05 NOTE — Progress Notes (Signed)
Provider: Richarda Blade FNP-C   Carrie Jensen, Carrie Citrin, NP  Patient Care Team: Makinze Jani, Carrie Citrin, NP as PCP - General (Family Medicine)  Extended Emergency Contact Information Primary Emergency Contact: Compton,Wally Address: [address]          [place], [place] [address] [place] of [place] Mobile Phone: [phone] Relation: Spouse  Code Status:  Full Code  Goals of care: Advanced Directive information    08/05/2022    2:06 PM  Advanced Directives  Does Patient Have a Medical Advance Directive? No  Would patient like information on creating a medical advance directive? No - Patient declined     Chief Complaint  Patient presents with   Establish Care    New patient here to establish care    HPI:  Pt is a 52 y.o. female seen today establish care here at [facility] for medical management of chronic diseases.  Hyperthyroidism - TSH 0.17   Anxiety /Depression - on Venlafaxine and Alprazolam.Has been on venlafaxine since 2020.Tried to wean off.Anax prescribed by Gyn.   GERD - on Omeprazole three times per week.Trying to cut off.  Migraine - on Imitrex and Phenergan.Has not required Imitrex since January,2024.   IBS - with constipation.states she denies any n/V /D   Due for D Tap,shingles and COVID-19 vaccine.made aware to get vaccine at the pharmacy.    Mammogram,Colonoscopy and pap smear up to date.    Past Medical History:  Diagnosis Date   Anxiety    Cancer (HCC)    CIN II (cervical intraepithelial neoplasia II) 1998   LEEP   Depression    Endometriosis    GERD (gastroesophageal reflux disease)    Hyperthyroidism    DR. BALAN   IBS (irritable bowel syndrome)    Migraines    Plantar fasciitis of left foot    Past Surgical History:  Procedure Laterality Date   CERVICAL BIOPSY  W/ LOOP ELECTRODE EXCISION  01/18/1996   CIN II   CHOLECYSTECTOMY N/A 05/14/2021   Procedure: LAPAROSCOPIC CHOLECYSTECTOMY WITH INTRAOPERATIVE  CHOLANGIOGRAM;  Surgeon: Darnell Level, MD;  Location: [facility];  Service: General;  Laterality: N/A;   COLONOSCOPY     MOHS SURGERY     OVARIAN CYSTECTOMY, PARTIAL LSO, FULGURATION OF ENDOMETRIOSIS      Allergies  Allergen Reactions   Erythromycin Nausea And Vomiting   Other Nausea And Vomiting and Other (See Comments)    Possible reaction to Anesthesia (afterwards) = "Cold/hot feeling, cannot keep down food, N/V, and stomach pain"    Allergies as of 08/05/2022       Reactions   Erythromycin Nausea And Vomiting   Other Nausea And Vomiting, Other (See Comments)   Possible reaction to Anesthesia (afterwards) = "Cold/hot feeling, cannot keep down food, N/V, and stomach pain"        Medication List        Accurate as of August 05, 2022 11:59 PM. If you have any questions, ask your nurse or doctor.          STOP taking these medications    CALCIUM PO Stopped by: Yordy Matton C Maxwell Lemen   CO Q 10 PO Stopped by: Carilyn Goodpasture C Nalu Troublefield   FISH OIL PO Stopped by: Carrie Jensen Lateef Juncaj   GENTEAL OP Stopped by: Carrie Jensen Adriel Desrosier   Melatonin 10 MG Tabs Stopped by: Carrie Jensen Lesley Galentine   VITAMIN B-12 PO Stopped by: Carrie Jensen Damarcus Reggio   VITAMIN D3 PO Stopped by: Carrie Jensen  Yessika Otte   ZINC PO Stopped by: Carrie Jensen Natalin Bible       TAKE these medications    ALPRAZolam 0.25 MG tablet Commonly known as: XANAX Take 1 tablet (0.25 mg total) by mouth at bedtime as needed for anxiety.   ascorbic acid 500 MG tablet Commonly known as: VITAMIN C Take 500 mg by mouth at bedtime.   omeprazole 20 MG tablet Commonly known as: PRILOSEC OTC Take 20 mg by mouth at bedtime.   OVER THE COUNTER MEDICATION Take 1 packet by mouth daily. Pure encapsulations dietary supplement  1/2 pack daily   PROBIOTIC-10 PO Take 1 tablet by mouth at bedtime.   promethazine 25 MG tablet Commonly known as: PHENERGAN Take 1 tablet (25 mg total) by mouth every 6 (six) hours as needed for nausea or vomiting.   SUMAtriptan 50 MG  tablet Commonly known as: IMITREX Take 1 tablet (50 mg total) by mouth every 2 (two) hours as needed for migraine. May repeat in 2 hours if headache persists or recurs.   Trulance 3 MG Tabs Generic drug: Plecanatide TAKE ONE TABLET BY MOUTH EVERY MORNING   venlafaxine XR 37.5 MG 24 hr capsule Commonly known as: Effexor XR Take 1 capsule (37.5 mg total) by mouth daily with breakfast. What changed:  medication strength how much to take additional instructions Changed by: Carrie Jensen Rhonda Vangieson   venlafaxine 75 MG tablet Commonly known as: EFFEXOR Take 1 tablet (75 mg total) by mouth daily. Take along with 37.5 mg tablet What changed: You were already taking a medication with the same name, and this prescription was added. Make sure you understand how and when to take each. Changed by: Carrie Jensen Zaryan Yakubov        Review of Systems  Constitutional:  Negative for appetite change, chills, fatigue, fever and unexpected weight change.  HENT:  Negative for congestion, dental problem, ear discharge, ear pain, facial swelling, hearing loss, nosebleeds, postnasal drip, rhinorrhea, sinus pressure, sinus pain, sneezing, sore throat, tinnitus and trouble swallowing.   Eyes:  Negative for pain, discharge, redness, itching and visual disturbance.  Respiratory:  Negative for cough, chest tightness, shortness of breath and wheezing.   Cardiovascular:  Negative for chest pain, palpitations and leg swelling.  Gastrointestinal:  Negative for abdominal distention, abdominal pain, blood in stool, constipation, diarrhea, nausea and vomiting.  Endocrine: Negative for cold intolerance, heat intolerance, polydipsia, polyphagia and polyuria.  Genitourinary:  Positive for urgency. Negative for difficulty urinating, dysuria, flank pain and frequency.  Musculoskeletal:  Negative for arthralgias, back pain, gait problem, joint swelling, myalgias, neck pain and neck stiffness.  Skin:  Negative for color change, pallor, rash  and wound.  Neurological:  Negative for dizziness, syncope, speech difficulty, weakness, light-headedness, numbness and headaches.  Hematological:  Does not bruise/bleed easily.  Psychiatric/Behavioral:  Negative for agitation, behavioral problems, confusion, hallucinations, self-injury, sleep disturbance and suicidal ideas. The patient is not nervous/anxious.     Immunization History  Administered Date(s) Administered   Tdap 08/05/2022   Pertinent  Health Maintenance Due  Topic Date Due   INFLUENZA VACCINE  08/18/2022   MAMMOGRAM  03/12/2023   PAP SMEAR-Modifier  03/16/2025   Colonoscopy  05/08/2031      05/13/2021    7:50 PM 05/14/2021    8:14 AM 05/14/2021    8:00 PM 05/15/2021    8:45 AM 08/05/2022    2:06 PM  Fall Risk  Falls in the past year?     0  Was there an  injury with Fall?     0  Fall Risk Category Calculator     0  (RETIRED) Patient Fall Risk Level Moderate fall risk Low fall risk Low fall risk Low fall risk   Patient at Risk for Falls Due to     No Fall Risks  Fall risk Follow up     Falls evaluation completed   Functional Status Survey:    Vitals:   08/05/22 1407  BP: 120/68  Pulse: 69  Resp: 18  Temp: (!) 97.3 F (36.3 C)  SpO2: 99%  Weight: 126 lb (57.2 kg)  Height: 5' 0.5" (1.537 m)   Body mass index is 24.2 kg/m. Physical Exam Vitals reviewed.  Constitutional:      General: She is not in acute distress.    Appearance: Normal appearance. She is normal weight. She is not ill-appearing or diaphoretic.  HENT:     Head: Normocephalic.     Right Ear: Tympanic membrane, ear canal and external ear normal. There is no impacted cerumen.     Left Ear: Tympanic membrane, ear canal and external ear normal. There is no impacted cerumen.     Nose: Nose normal. No congestion or rhinorrhea.     Mouth/Throat:     Mouth: Mucous membranes are moist.     Pharynx: Oropharynx is clear. No oropharyngeal exudate or posterior oropharyngeal erythema.  Eyes:      General: No scleral icterus.       Right eye: No discharge.        Left eye: No discharge.     Extraocular Movements: Extraocular movements intact.     Conjunctiva/sclera: Conjunctivae normal.     Pupils: Pupils are equal, round, and reactive to light.  Neck:     Vascular: No carotid bruit.  Cardiovascular:     Rate and Rhythm: Normal rate and regular rhythm.     Pulses: Normal pulses.     Heart sounds: Normal heart sounds. No murmur heard.    No friction rub. No gallop.  Pulmonary:     Effort: Pulmonary effort is normal. No respiratory distress.     Breath sounds: Normal breath sounds. No wheezing, rhonchi or rales.  Chest:     Chest wall: No tenderness.  Abdominal:     General: Bowel sounds are normal. There is no distension.     Palpations: Abdomen is soft. There is no mass.     Tenderness: There is no abdominal tenderness. There is no right CVA tenderness, left CVA tenderness, guarding or rebound.  Musculoskeletal:        General: No swelling or tenderness. Normal range of motion.     Cervical back: Normal range of motion. No rigidity or tenderness.     Right lower leg: No edema.     Left lower leg: No edema.  Lymphadenopathy:     Cervical: No cervical adenopathy.  Skin:    General: Skin is warm and dry.     Coloration: Skin is not pale.     Findings: No bruising, erythema, lesion or rash.  Neurological:     Mental Status: She is alert and oriented to person, place, and time.     Cranial Nerves: No cranial nerve deficit.     Sensory: No sensory deficit.     Motor: No weakness.     Coordination: Coordination normal.     Gait: Gait normal.  Psychiatric:        Mood and Affect: Mood normal.  Speech: Speech normal.        Behavior: Behavior normal.        Thought Content: Thought content normal.        Judgment: Judgment normal.    Labs reviewed: Recent Labs    08/05/22 1455  NA 139  K 4.0  CL 104  CO2 28  GLUCOSE 85  BUN 11  CREATININE 0.58  CALCIUM  9.7   Recent Labs    08/05/22 1455  AST 22  ALT 11  BILITOT 1.3*  PROT 6.4   Recent Labs    08/05/22 1455  WBC 7.9  NEUTROABS 3,650  HGB 14.3  HCT 42.6  MCV 97.3  PLT 277   Lab Results  Component Value Date   TSH 0.17 (L) 08/05/2022   No results found for: "HGBA1C" Lab Results  Component Value Date   CHOL 188 08/05/2022   HDL 76 08/05/2022   LDLCALC 93 08/05/2022   TRIG 96 08/05/2022   CHOLHDL 2.5 08/05/2022    Significant Diagnostic Results in last 30 days:  No results found.  Assessment/Plan  1. Hyperthyroidism Lab Results  Component Value Date   TSH 0.17 (L) 08/05/2022   - TSH  2. Generalized anxiety disorder Continue on Alprazolam   3. Irritable bowel syndrome, unspecified type Chronic  Continue on Plecanatide   4. Gastroesophageal reflux disease with esophagitis without hemorrhage Symptoms controlled. H/H stable.No tarry or black stool  - advised to avoid eating meals late in the evening and to avoid aggravating foods and spices. - continue on Omeprazole  - COMPLETE METABOLIC PANEL WITH GFR - CBC with Differential/Platelet  5. Migraine without aura and without status migrainosus, not intractable Continue on Imitrex  - CBC with Differential/Platelet  6. Encounter for medical examination to establish care Immunization reviewed due for shingles and COVID-19 vaccine.made aware to get vaccine at the pharmacy.   7. Depression with anxiety Mood stable  - continue on venlafaxine  - Lipid panel - venlafaxine XR (EFFEXOR XR) 37.5 MG 24 hr capsule; Take 1 capsule (37.5 mg total) by mouth daily with breakfast.  Dispense: 30 capsule; Refill: 0 - venlafaxine (EFFEXOR) 75 MG tablet; Take 1 tablet (75 mg total) by mouth daily. Take along with 37.5 mg tablet  Dispense: 30 tablet; Refill: 0  8. Need for Tdap vaccination  vaccine administered today by CMA no reaction reported.  - Tdap vaccine greater than or equal to 7yo IM  9. Urinary urgency Will  obtain urine specimen r/o UTI - Culture, Urine  Family/ staff Communication: Reviewed plan of care with patient verbalized understanding.   Labs/tests ordered:  - COMPLETE METABOLIC PANEL WITH GFR - CBC with Differential/Platelet - Lipid panel - TSH - Culture, Urine  Next Appointment : Return in about 1 month (around 09/05/2022) for Depression.   Caesar Bookman, NP

## 2022-08-06 LAB — COMPLETE METABOLIC PANEL WITH GFR
AST: 22 U/L (ref 10–35)
Alkaline phosphatase (APISO): 84 U/L (ref 37–153)
BUN: 11 mg/dL (ref 7–25)
Chloride: 104 mmol/L (ref 98–110)
Total Protein: 6.4 g/dL (ref 6.1–8.1)
eGFR: 107 mL/min/{1.73_m2} (ref >=60)

## 2022-08-06 LAB — CBC WITH DIFFERENTIAL/PLATELET
Eosinophils Absolute: 71 cells/uL (ref 15–500)
Hemoglobin: 14.3 g/dL (ref 11.7–15.5)
MCV: 97.3 fL (ref 80.0–100.0)
Monocytes Relative: 4.5 %
Platelets: 277 10*3/uL (ref 140–400)
RDW: 11.7 % (ref 11.0–15.0)
Total Lymphocyte: 48 %
WBC: 7.9 10*3/uL (ref 3.8–10.8)

## 2022-08-06 LAB — LIPID PANEL
Non-HDL Cholesterol (Calc): 112 mg/dL (calc) (ref <130)
Total CHOL/HDL Ratio: 2.5 (calc) (ref <5.0)

## 2022-08-07 LAB — CBC WITH DIFFERENTIAL/PLATELET
Basophils Absolute: 32 cells/uL (ref 0–200)
Lymphs Abs: 3792 cells/uL (ref 850–3900)
Neutro Abs: 3650 cells/uL (ref 1500–7800)
Neutrophils Relative %: 46.2 %
RBC: 4.38 10*6/uL (ref 3.80–5.10)

## 2022-08-07 LAB — URINE CULTURE: MICRO NUMBER:: 15224188

## 2022-08-07 LAB — COMPLETE METABOLIC PANEL WITH GFR
AG Ratio: 1.9 (calc) (ref 1.0–2.5)
CO2: 28 mmol/L (ref 20–32)
Calcium: 9.7 mg/dL (ref 8.6–10.4)
Creat: 0.58 mg/dL (ref 0.50–1.03)
Sodium: 139 mmol/L (ref 135–146)
Total Bilirubin: 1.3 mg/dL — ABNORMAL HIGH (ref 0.2–1.2)

## 2022-08-07 LAB — TSH: TSH: 0.17 mIU/L — ABNORMAL LOW

## 2022-08-11 LAB — COMPLETE METABOLIC PANEL WITH GFR
ALT: 11 U/L (ref 6–29)
Albumin: 4.2 g/dL (ref 3.6–5.1)
Globulin: 2.2 g/dL (calc) (ref 1.9–3.7)
Glucose, Bld: 85 mg/dL (ref 65–99)
Potassium: 4 mmol/L (ref 3.5–5.3)

## 2022-08-11 LAB — URINE CULTURE: SPECIMEN QUALITY:: ADEQUATE

## 2022-08-11 LAB — CBC WITH DIFFERENTIAL/PLATELET
Absolute Monocytes: 356 cells/uL (ref 200–950)
Basophils Relative: 0.4 %
Eosinophils Relative: 0.9 %
HCT: 42.6 % (ref 35.0–45.0)
MCH: 32.6 pg (ref 27.0–33.0)
MCHC: 33.6 g/dL (ref 32.0–36.0)
MPV: 8.9 fL (ref 7.5–12.5)

## 2022-08-11 LAB — LIPID PANEL
Cholesterol: 188 mg/dL (ref <200)
HDL: 76 mg/dL (ref >=50)
LDL Cholesterol (Calc): 93 mg/dL (calc)
Triglycerides: 96 mg/dL (ref <150)

## 2022-09-04 ENCOUNTER — Other Ambulatory Visit: Payer: Self-pay | Admitting: Family

## 2022-09-04 DIAGNOSIS — F418 Other specified anxiety disorders: Secondary | ICD-10-CM

## 2022-09-05 NOTE — Telephone Encounter (Signed)
Patient medication has warnings.

## 2022-09-05 NOTE — Telephone Encounter (Signed)
Effexor refilled.

## 2022-09-06 ENCOUNTER — Other Ambulatory Visit: Payer: Self-pay | Admitting: Gastroenterology

## 2022-09-06 DIAGNOSIS — K581 Irritable bowel syndrome with constipation: Secondary | ICD-10-CM

## 2022-09-09 ENCOUNTER — Telehealth: Payer: Self-pay | Admitting: Gastroenterology

## 2022-09-09 ENCOUNTER — Ambulatory Visit: Payer: BC Managed Care – PPO | Admitting: Adult Health

## 2022-09-09 DIAGNOSIS — K581 Irritable bowel syndrome with constipation: Secondary | ICD-10-CM

## 2022-09-09 MED ORDER — TRULANCE 3 MG PO TABS
1.0000 | ORAL_TABLET | Freq: Every morning | ORAL | 3 refills | Status: DC
Start: 2022-09-09 — End: 2022-11-29

## 2022-09-09 NOTE — Telephone Encounter (Signed)
Sent script to pharmacy. 

## 2022-09-09 NOTE — Telephone Encounter (Signed)
Inbound call from patient requesting refill for Trulance. Please advise.

## 2022-09-09 NOTE — Telephone Encounter (Signed)
Patient has been scheduled for 11/12 at 3:00 patient is on the way to pharmacy to pick up other medications and is wanting refill to be sent. Please advise.

## 2022-09-09 NOTE — Telephone Encounter (Signed)
Left message for patient to call office. Patient needs office visit for additional refills on Trulance.

## 2022-09-16 ENCOUNTER — Telehealth: Payer: Self-pay

## 2022-09-16 ENCOUNTER — Ambulatory Visit: Payer: BC Managed Care – PPO | Admitting: Adult Health

## 2022-09-16 NOTE — Telephone Encounter (Signed)
I agree.please go to urgent care if symptoms worsen.

## 2022-09-16 NOTE — Telephone Encounter (Signed)
Patient called wanting to know if she could have medication sent to pharmacy since she was unable to schedule a visit today. Patient tested positive for COVID on yesterday.   I spoke with Richarda Blade, NP and she advised me to tell the patient to go to urgent care since medications can't be prescribed without a visit.  Patient stated that she does not feel like going anywhere so she will just wait it out and see what happens. I again told patient that if she states feeling worse to be sure to go to urgent care.  Message sent to Richarda Blade, NP

## 2022-09-17 ENCOUNTER — Ambulatory Visit
Admission: EM | Admit: 2022-09-17 | Discharge: 2022-09-17 | Disposition: A | Discharge status: Home / Self Care | Payer: BC Managed Care – PPO | Attending: Physician Assistant | Admitting: Physician Assistant

## 2022-09-17 DIAGNOSIS — U071 COVID-19: Secondary | ICD-10-CM

## 2022-09-17 MED ORDER — NIRMATRELVIR/RITONAVIR (PAXLOVID)TABLET: 3.0000 | ORAL_TABLET | Freq: Two times a day (BID) | ORAL | 0 refills | Status: AC

## 2022-09-17 NOTE — ED Provider Notes (Signed)
[name]    CSN: [id number] Arrival date & time: 09/17/22  0800      History   Chief Complaint Chief Complaint  Patient presents with   COVID19    + @ home Test.    HPI Carrie Jensen is a 52 y.o. female.   Patient here today for evaluation of sore throat, chills, and cough that started about 2 days ago.  She reports she has also had bodyaches.  She has not had any vomiting or diarrhea.  She has tried over-the-counter medication without resolution.  She states she took a COVID test at home that was positive.  The history is provided by the patient.    Past Medical History:  Diagnosis Date   Anxiety    Cancer (HCC)    CIN II (cervical intraepithelial neoplasia II) 1998   LEEP   Depression    Endometriosis    GERD (gastroesophageal reflux disease)    Hyperthyroidism    DR. BALAN   IBS (irritable bowel syndrome)    Migraines    Plantar fasciitis of left foot     Patient Active Problem List   Diagnosis Date Noted   Irritable bowel syndrome with constipation 06/08/2021   RUQ abdominal pain    Intractable nausea and vomiting    Intractable abdominal pain 05/11/2021   Hyperbilirubinemia 05/11/2021   Hypokalemia 05/11/2021   Cholelithiasis 05/11/2021   Nevus of choroid of right eye 12/09/2016   Anxiety    Endometriosis    Hyperthyroidism     Past Surgical History:  Procedure Laterality Date   CERVICAL BIOPSY  W/ LOOP ELECTRODE EXCISION  01/18/1996   CIN II   CHOLECYSTECTOMY N/A 05/14/2021   Procedure: LAPAROSCOPIC CHOLECYSTECTOMY WITH INTRAOPERATIVE CHOLANGIOGRAM;  Surgeon: Darnell Level, MD;  Location: [facility];  Service: General;  Laterality: N/A;   COLONOSCOPY     MOHS SURGERY     OVARIAN CYSTECTOMY, PARTIAL LSO, FULGURATION OF ENDOMETRIOSIS      OB History     Gravida  0   Para  0   Term  0   Preterm  0   AB  0   Living  0      SAB  0   IAB  0   Ectopic  0   Multiple  0   Live Births  0            Home  Medications    Prior to Admission medications   Medication Sig Start Date End Date Taking? Authorizing Provider  nirmatrelvir/ritonavir (PAXLOVID) 20 x 150 MG & 10 x 100MG  TABS Take 3 tablets by mouth 2 (two) times daily for 5 days. Patient GFR is 107. Take nirmatrelvir (150 mg) two tablets twice daily for 5 days and ritonavir (100 mg) one tablet twice daily for 5 days. 09/17/22 09/22/22 Yes Tomi Bamberger, PA-C  Plecanatide (TRULANCE) 3 MG TABS Take 1 tablet (3 mg total) by mouth every morning. Patient taking differently: Take 1 tablet by mouth every morning. Last taken: Wednesday. 09/09/22  Yes Zehr, Princella Pellegrini, PA-C  venlafaxine (EFFEXOR) 75 MG tablet TAKE 1 TABLET BY MOUTH DAILY ALONG WITH 37.5MG  TABLET Patient taking differently: Last taken: Wednesday. 09/05/22  Yes Ngetich, Dinah C, NP  ALPRAZolam (XANAX) 0.25 MG tablet Take 1 tablet (0.25 mg total) by mouth at bedtime as needed for anxiety. 03/16/22   Olivia Mackie, NP  omeprazole (PRILOSEC OTC) 20 MG tablet Take 20 mg by mouth at bedtime.  [provider]  OVER THE COUNTER MEDICATION Take 1 packet by mouth daily. Pure encapsulations dietary supplement  1/2 pack daily    [provider]  Probiotic Product (PROBIOTIC-10 PO) Take 1 tablet by mouth at bedtime.    [provider]  promethazine (PHENERGAN) 25 MG tablet Take 1 tablet (25 mg total) by mouth every 6 (six) hours as needed for nausea or vomiting. 03/16/22   Wyline Beady A, NP  SUMAtriptan (IMITREX) 50 MG tablet Take 1 tablet (50 mg total) by mouth every 2 (two) hours as needed for migraine. May repeat in 2 hours if headache persists or recurs. 03/16/22   Wyline Beady A, NP  venlafaxine XR (EFFEXOR-XR) 37.5 MG 24 hr capsule TAKE 1 CAPSULE BY MOUTH DAILY WITH BREAKFAST 09/05/22   Ngetich, Dinah C, NP  vitamin C (ASCORBIC ACID) 500 MG tablet Take 500 mg by mouth at bedtime.    [provider]    Family History Family History  Problem  Relation Age of Onset   Colon polyps Brother    Cancer Maternal Aunt        OVARIAN   Heart disease Maternal Grandmother    Stroke Maternal Grandmother    Diabetes Maternal Grandfather    Hypertension Maternal Grandfather    Heart disease Maternal Grandfather    Heart disease Paternal Grandmother    Heart disease Paternal Grandfather    Breast cancer Neg Hx    Colon cancer Neg Hx    Esophageal cancer Neg Hx    Stomach cancer Neg Hx    Rectal cancer Neg Hx     Social History Social History   Tobacco Use   Smoking status: Never   Smokeless tobacco: Never  Vaping Use   Vaping status: Never Used  Substance Use Topics   Alcohol use: No   Drug use: No     Allergies   Erythromycin and Other   Review of Systems Review of Systems  Constitutional:  Positive for chills and fever (subjective).  HENT:  Positive for congestion and sore throat. Negative for ear pain.   Eyes:  Negative for discharge and redness.  Respiratory:  Positive for cough. Negative for shortness of breath and wheezing.   Gastrointestinal:  Negative for abdominal pain, diarrhea, nausea and vomiting.  Musculoskeletal:  Positive for myalgias.     Physical Exam Triage Vital Signs ED Triage Vitals  Encounter Vitals Group     BP 09/17/22 0811 118/62     Systolic BP Percentile --      Diastolic BP Percentile --      Pulse Rate 09/17/22 0811 82     Resp 09/17/22 0811 18     Temp 09/17/22 0811 98.6 F (37 C)     Temp Source 09/17/22 0811 Oral     SpO2 09/17/22 0811 98 %     Weight 09/17/22 0809 130 lb (59 kg)     Height 09/17/22 0809 5' 1.5" (1.562 m)     Head Circumference --      Peak Flow --      Pain Score 09/17/22 0806 0     Pain Loc --      Pain Education --      Exclude from Growth Chart --    No data found.  Updated Vital Signs BP 118/62 (BP Location: Left Arm)   Pulse 82   Temp 98.6 F (37 C) (Oral)   Resp 18   Ht 5' 1.5" (1.562 m)  Wt 130 lb (59 kg)   LMP  (LMP Unknown) Comment:  takes birth control cont  SpO2 98%   BMI 24.17 kg/m      Physical Exam Vitals and nursing note reviewed.  Constitutional:      General: She is not in acute distress.    Appearance: Normal appearance. She is not ill-appearing.  HENT:     Head: Normocephalic and atraumatic.     Nose: Congestion present.     Mouth/Throat:     Mouth: Mucous membranes are moist.     Pharynx: No oropharyngeal exudate or posterior oropharyngeal erythema.  Eyes:     Conjunctiva/sclera: Conjunctivae normal.  Cardiovascular:     Rate and Rhythm: Normal rate and regular rhythm.     Heart sounds: Normal heart sounds. No murmur heard. Pulmonary:     Effort: Pulmonary effort is normal. No respiratory distress.     Breath sounds: Normal breath sounds. No wheezing, rhonchi or rales.  Skin:    General: Skin is warm and dry.  Neurological:     Mental Status: She is alert.  Psychiatric:        Mood and Affect: Mood normal.        Thought Content: Thought content normal.      UC Treatments / Results  Labs (all labs ordered are listed, but only abnormal results are displayed) Labs Reviewed - No data to display  EKG   Radiology No results found.  Procedures Procedures (including critical care time)  Medications Ordered in UC Medications - No data to display  Initial Impression / Assessment and Plan / UC Course  I have reviewed the triage vital signs and the nursing notes.  Pertinent labs & imaging results that were available during my care of the patient were reviewed by me and considered in my medical decision making (see chart for details).    Discussed options for antiviral therapy and patient would like to proceed with same.  Paxlovid prescribed for treatment of COVID.  Recommended symptomatic treatment, increase rest and fluids.  Encouraged follow-up if no gradual improvement or with any worsening symptoms.  Final Clinical Impressions(s) / UC Diagnoses   Final diagnoses:  COVID-19    Discharge Instructions   None    ED Prescriptions     Medication Sig Dispense Auth. Provider   nirmatrelvir/ritonavir (PAXLOVID) 20 x 150 MG & 10 x 100MG  TABS Take 3 tablets by mouth 2 (two) times daily for 5 days. Patient GFR is 107. Take nirmatrelvir (150 mg) two tablets twice daily for 5 days and ritonavir (100 mg) one tablet twice daily for 5 days. 30 tablet Tomi Bamberger, PA-C      PDMP not reviewed this encounter.   Tomi Bamberger, PA-C 09/17/22 [phone]

## 2022-09-17 NOTE — ED Triage Notes (Signed)
Started with "sore throat" thursday at work then chills "feeling cold". + test at work same day. "Remaining body aches and possible fever on/off". Slight cough. Some runny nose.

## 2022-09-21 ENCOUNTER — Encounter: Payer: Self-pay | Admitting: Family

## 2022-09-21 ENCOUNTER — Ambulatory Visit: Payer: BC Managed Care – PPO | Admitting: Family

## 2022-09-21 VITALS — BP 110/70 | HR 68 | Temp 97.7°F | Resp 18 | Ht 60.05 in | Wt 118.0 lb

## 2022-09-21 DIAGNOSIS — U071 COVID-19: Secondary | ICD-10-CM

## 2022-09-21 DIAGNOSIS — J069 Acute upper respiratory infection, unspecified: Secondary | ICD-10-CM | POA: Diagnosis not present

## 2022-09-21 DIAGNOSIS — R110 Nausea: Secondary | ICD-10-CM

## 2022-09-21 MED ORDER — DOXYCYCLINE HYCLATE 100 MG PO TABS
100.0000 mg | ORAL_TABLET | Freq: Two times a day (BID) | ORAL | 0 refills | Status: AC
Start: 2022-09-21 — End: 2022-09-28

## 2022-09-21 MED ORDER — ZINC GLUCONATE 50 MG PO TABS
50.0000 mg | ORAL_TABLET | Freq: Every day | ORAL | 0 refills | Status: AC
Start: 2022-09-21 — End: 2022-10-05

## 2022-09-21 MED ORDER — VITAMIN D3 50 MCG (2000 UT) PO CAPS
2000.0000 [IU] | ORAL_CAPSULE | Freq: Every day | ORAL | 0 refills | Status: AC
Start: 2022-09-21 — End: 2022-10-05

## 2022-09-21 MED ORDER — ONDANSETRON HCL 4 MG PO TABS: 4.0000 mg | ORAL_TABLET | Freq: Three times a day (TID) | ORAL | 0 refills | Status: DC | PRN

## 2022-09-21 NOTE — Progress Notes (Incomplete)
Provider: Richarda Blade FNP-C  Kymberley Raz, Donalee Citrin, NP  Patient Care Team: Keino Placencia, Donalee Citrin, NP as PCP - General (Family Medicine)  Extended Emergency Contact Information Primary Emergency Contact: Compton,Wally Address: [address]          [place], [place] [address] Darden Amber of [place] Mobile Phone: [phone] Relation: Spouse  Code Status: Full Code  Goals of care: Advanced Directive information    09/21/2022   10:47 AM  Advanced Directives  Does Patient Have a Medical Advance Directive? No     Chief Complaint  Patient presents with  . Follow-up    Follow up COVID + - stopped paxlovid due to nausea and vomiting. Still having sore throat, cough, congestion    HPI:  Pt is a 52 y.o. female seen today for an acute visit for evaluation of COVID-19 positive test on  Has runny nose an sore throat when lying in the bed  Past Medical History:  Diagnosis Date  . Anxiety   . Cancer (HCC)   . CIN II (cervical intraepithelial neoplasia II) 1998   LEEP  . Depression   . Endometriosis   . GERD (gastroesophageal reflux disease)   . Hyperthyroidism    DR. BALAN  . IBS (irritable bowel syndrome)   . Migraines   . Plantar fasciitis of left foot    Past Surgical History:  Procedure Laterality Date  . CERVICAL BIOPSY  W/ LOOP ELECTRODE EXCISION  01/18/1996   CIN II  . CHOLECYSTECTOMY N/A 05/14/2021   Procedure: LAPAROSCOPIC CHOLECYSTECTOMY WITH INTRAOPERATIVE CHOLANGIOGRAM;  Surgeon: Darnell Level, MD;  Location: [facility];  Service: General;  Laterality: N/A;  . COLONOSCOPY    . MOHS SURGERY    . OVARIAN CYSTECTOMY, PARTIAL LSO, FULGURATION OF ENDOMETRIOSIS      Allergies  Allergen Reactions  . Erythromycin Nausea And Vomiting    Other Reaction(s): GI Intolerance  . Other Nausea And Vomiting and Other (See Comments)    Possible reaction to Anesthesia (afterwards) = "Cold/hot feeling, cannot keep down food, N/V, and stomach pain"    Outpatient Encounter  Medications as of 09/21/2022  Medication Sig  . ALPRAZolam (XANAX) 0.25 MG tablet Take 1 tablet (0.25 mg total) by mouth at bedtime as needed for anxiety.  . nirmatrelvir/ritonavir (PAXLOVID) 20 x 150 MG & 10 x 100MG  TABS Take 3 tablets by mouth 2 (two) times daily for 5 days. Patient GFR is 107. Take nirmatrelvir (150 mg) two tablets twice daily for 5 days and ritonavir (100 mg) one tablet twice daily for 5 days.  Marland Kitchen omeprazole (PRILOSEC OTC) 20 MG tablet Take 20 mg by mouth at bedtime.  Marland Kitchen OVER THE COUNTER MEDICATION Take 1 packet by mouth daily. Pure encapsulations dietary supplement  1/2 pack daily  . Plecanatide (TRULANCE) 3 MG TABS Take 1 tablet (3 mg total) by mouth every morning. (Patient taking differently: Take 1 tablet by mouth every morning. Last taken: Wednesday.)  . Probiotic Product (PROBIOTIC-10 PO) Take 1 tablet by mouth at bedtime.  . promethazine (PHENERGAN) 25 MG tablet Take 1 tablet (25 mg total) by mouth every 6 (six) hours as needed for nausea or vomiting.  . SUMAtriptan (IMITREX) 50 MG tablet Take 1 tablet (50 mg total) by mouth every 2 (two) hours as needed for migraine. May repeat in 2 hours if headache persists or recurs.  . venlafaxine (EFFEXOR) 75 MG tablet TAKE 1 TABLET BY MOUTH DAILY ALONG WITH 37.5MG  TABLET (Patient taking differently: Last taken: Wednesday.)  .  venlafaxine XR (EFFEXOR-XR) 37.5 MG 24 hr capsule TAKE 1 CAPSULE BY MOUTH DAILY WITH BREAKFAST  . vitamin C (ASCORBIC ACID) 500 MG tablet Take 500 mg by mouth at bedtime.   No facility-administered encounter medications on file as of 09/21/2022.    Review of Systems  Immunization History  Administered Date(s) Administered  . Tdap 08/05/2022   Pertinent  Health Maintenance Due  Topic Date Due  . INFLUENZA VACCINE  Never done  . MAMMOGRAM  03/12/2023  . PAP SMEAR-Modifier  03/16/2025  . Colonoscopy  05/08/2031      05/14/2021    8:14 AM 05/14/2021    8:00 PM 05/15/2021    8:45 AM 08/05/2022    2:06 PM  09/21/2022   10:47 AM  Fall Risk  Falls in the past year?    0 0  Was there an injury with Fall?    0   Fall Risk Category Calculator    0   (RETIRED) Patient Fall Risk Level Low fall risk Low fall risk Low fall risk    Patient at Risk for Falls Due to    No Fall Risks   Fall risk Follow up    Falls evaluation completed    Functional Status Survey:    Vitals:   09/21/22 1048  BP: 110/70  Pulse: 68  Resp: 18  Temp: 97.7 F (36.5 C)  SpO2: 96%  Weight: 118 lb (53.5 kg)  Height: 5' 0.05" (1.525 m)   Body mass index is 23.01 kg/m. Physical Exam  Labs reviewed: Recent Labs    08/05/22 1455  NA 139  K 4.0  CL 104  CO2 28  GLUCOSE 85  BUN 11  CREATININE 0.58  CALCIUM 9.7   Recent Labs    08/05/22 1455  AST 22  ALT 11  BILITOT 1.3*  PROT 6.4   Recent Labs    08/05/22 1455  WBC 7.9  NEUTROABS 3,650  HGB 14.3  HCT 42.6  MCV 97.3  PLT 277   Lab Results  Component Value Date   TSH 0.17 (L) 08/05/2022   No results found for: "HGBA1C" Lab Results  Component Value Date   CHOL 188 08/05/2022   HDL 76 08/05/2022   LDLCALC 93 08/05/2022   TRIG 96 08/05/2022   CHOLHDL 2.5 08/05/2022    Significant Diagnostic Results in last 30 days:  No results found.  Assessment/Plan There are no diagnoses linked to this encounter.   Family/ staff Communication: Reviewed plan of care with patient  Labs/tests ordered: None   Next Appointment:   Caesar Bookman, NP

## 2022-09-23 ENCOUNTER — Ambulatory Visit: Payer: BC Managed Care – PPO | Admitting: Family

## 2022-10-03 NOTE — Progress Notes (Signed)
Provider: Richarda Blade FNP-C  Tomio Kirk, Donalee Citrin, NP  Patient Care Team: Linkin Vizzini, Donalee Citrin, NP as PCP - General (Family Medicine)  Extended Emergency Contact Information Primary Emergency Contact: Compton,Wally Address: [address]          [place], [place] [address] Darden Amber of [place] Mobile Phone: [phone] Relation: Spouse  Code Status: Full Code  Goals of care: Advanced Directive information    09/21/2022   10:47 AM  Advanced Directives  Does Patient Have a Medical Advance Directive? No     Chief Complaint  Patient presents with   Follow-up    Follow up COVID + - stopped paxlovid due to nausea and vomiting. Still having sore throat, cough, congestion    HPI:  Pt is a 52 y.o. female seen today for an acute visit for evaluation of COVID-19 positive test on 09/15/2022 at home.she was seen in the [facility] urgent care on 09/17/2022 for fever,chills,cough,congestion,sore throat and myalgias.Paxlovid was prescribed and advised to increase rest and fluids. she was advised to follow up if symptoms worsen or fail to improved. Has runny nose,sore throat,cough and congested when lying in the bed.Has stopped Paxlovid due to nausea vomiting. She denies any fever or chills.   Past Medical History:  Diagnosis Date   Anxiety    Cancer (HCC)    CIN II (cervical intraepithelial neoplasia II) 1998   LEEP   Depression    Endometriosis    GERD (gastroesophageal reflux disease)    Hyperthyroidism    DR. BALAN   IBS (irritable bowel syndrome)    Migraines    Plantar fasciitis of left foot    Past Surgical History:  Procedure Laterality Date   CERVICAL BIOPSY  W/ LOOP ELECTRODE EXCISION  01/18/1996   CIN II   CHOLECYSTECTOMY N/A 05/14/2021   Procedure: LAPAROSCOPIC CHOLECYSTECTOMY WITH INTRAOPERATIVE CHOLANGIOGRAM;  Surgeon: Darnell Level, MD;  Location: [facility];  Service: General;  Laterality: N/A;   COLONOSCOPY     MOHS SURGERY     OVARIAN CYSTECTOMY, PARTIAL  LSO, FULGURATION OF ENDOMETRIOSIS      Allergies  Allergen Reactions   Erythromycin Nausea And Vomiting    Other Reaction(s): GI Intolerance   Other Nausea And Vomiting and Other (See Comments)    Possible reaction to Anesthesia (afterwards) = "Cold/hot feeling, cannot keep down food, N/V, and stomach pain"    Outpatient Encounter Medications as of 09/21/2022  Medication Sig   ALPRAZolam (XANAX) 0.25 MG tablet Take 1 tablet (0.25 mg total) by mouth at bedtime as needed for anxiety.   Cholecalciferol (VITAMIN D3) 50 MCG (2000 UT) capsule Take 1 capsule (2,000 Units total) by mouth daily for 14 days.   [EXPIRED] doxycycline (VIBRA-TABS) 100 MG tablet Take 1 tablet (100 mg total) by mouth 2 (two) times daily for 7 days.   [EXPIRED] nirmatrelvir/ritonavir (PAXLOVID) 20 x 150 MG & 10 x 100MG  TABS Take 3 tablets by mouth 2 (two) times daily for 5 days. Patient GFR is 107. Take nirmatrelvir (150 mg) two tablets twice daily for 5 days and ritonavir (100 mg) one tablet twice daily for 5 days.   omeprazole (PRILOSEC OTC) 20 MG tablet Take 20 mg by mouth at bedtime.   ondansetron (ZOFRAN) 4 MG tablet Take 1 tablet (4 mg total) by mouth every 8 (eight) hours as needed for nausea or vomiting.   OVER THE COUNTER MEDICATION Take 1 packet by mouth daily. Pure encapsulations dietary supplement  1/2 pack daily   Plecanatide (  TRULANCE) 3 MG TABS Take 1 tablet (3 mg total) by mouth every morning. (Patient taking differently: Take 1 tablet by mouth every morning. Last taken: Wednesday.)   Probiotic Product (PROBIOTIC-10 PO) Take 1 tablet by mouth at bedtime.   promethazine (PHENERGAN) 25 MG tablet Take 1 tablet (25 mg total) by mouth every 6 (six) hours as needed for nausea or vomiting.   SUMAtriptan (IMITREX) 50 MG tablet Take 1 tablet (50 mg total) by mouth every 2 (two) hours as needed for migraine. May repeat in 2 hours if headache persists or recurs.   venlafaxine (EFFEXOR) 75 MG tablet TAKE 1 TABLET BY MOUTH  DAILY ALONG WITH 37.5MG  TABLET (Patient taking differently: Last taken: Wednesday.)   venlafaxine XR (EFFEXOR-XR) 37.5 MG 24 hr capsule TAKE 1 CAPSULE BY MOUTH DAILY WITH BREAKFAST   vitamin C (ASCORBIC ACID) 500 MG tablet Take 500 mg by mouth at bedtime.   zinc gluconate 50 MG tablet Take 1 tablet (50 mg total) by mouth daily for 14 days.   No facility-administered encounter medications on file as of 09/21/2022.    Review of Systems Review of Systems  Constitutional:  Negative for chills, fever and malaise/fatigue.  HENT:  Positive for congestion and sore throat. Negative for ear pain and sinus pain.   Eyes:  Negative for pain, discharge and redness.  Respiratory:  Positive for cough. Negative for shortness of breath and wheezing.   Cardiovascular:  Negative for chest pain, palpitations, orthopnea and leg swelling.  Gastrointestinal:  Positive for nausea and vomiting. Negative for abdominal pain, constipation and diarrhea.  Genitourinary:  Negative for dysuria, frequency and urgency.  Musculoskeletal:  Negative for back pain, myalgias and neck pain.  Neurological:  Negative for dizziness, seizures, weakness and headaches.    Immunization History  Administered Date(s) Administered   Tdap 08/05/2022   Pertinent  Health Maintenance Due  Topic Date Due   INFLUENZA VACCINE  Never done   MAMMOGRAM  03/12/2023   PAP SMEAR-Modifier  03/16/2025   Colonoscopy  05/08/2031      05/14/2021    8:14 AM 05/14/2021    8:00 PM 05/15/2021    8:45 AM 08/05/2022    2:06 PM 09/21/2022   10:47 AM  Fall Risk  Falls in the past year?    0 0  Was there an injury with Fall?    0   Fall Risk Category Calculator    0   (RETIRED) Patient Fall Risk Level Low fall risk Low fall risk Low fall risk    Patient at Risk for Falls Due to    No Fall Risks   Fall risk Follow up    Falls evaluation completed    Functional Status Survey:    Vitals:   09/21/22 1048  BP: 110/70  Pulse: 68  Resp: 18  Temp: 97.7  F (36.5 C)  SpO2: 96%  Weight: 118 lb (53.5 kg)  Height: 5' 0.05" (1.525 m)   Body mass index is 23.01 kg/m.  Physical Exam Constitutional:      General: She is not in acute distress.    Appearance: She is not ill-appearing.  HENT:     Head: Normocephalic.     Right Ear: Tympanic membrane, ear canal and external ear normal. There is no impacted cerumen.     Left Ear: Tympanic membrane, ear canal and external ear normal. There is no impacted cerumen.     Nose: Nose normal. No congestion or rhinorrhea.     Mouth/Throat:  Mouth: Mucous membranes are moist.     Pharynx: Posterior oropharyngeal erythema present. No oropharyngeal exudate.  Eyes:     General: No scleral icterus.       Right eye: No discharge.        Left eye: No discharge.     Pupils: Pupils are equal, round, and reactive to light.  Neck:     Vascular: No carotid bruit.  Cardiovascular:     Rate and Rhythm: Normal rate and regular rhythm.     Pulses: Normal pulses.     Heart sounds: Normal heart sounds. No murmur heard.    No gallop.  Pulmonary:     Effort: Pulmonary effort is normal. No respiratory distress.     Breath sounds: Normal breath sounds. No wheezing, rhonchi or rales.  Chest:     Chest wall: No tenderness.  Abdominal:     General: There is no distension.     Palpations: Abdomen is soft. There is no mass.     Tenderness: There is no abdominal tenderness. There is no right CVA tenderness, left CVA tenderness, guarding or rebound.  Musculoskeletal:        General: No swelling or tenderness. Normal range of motion.     Cervical back: Normal range of motion. No rigidity or tenderness.     Right lower leg: No edema.     Left lower leg: No edema.  Lymphadenopathy:     Cervical: No cervical adenopathy.  Skin:    General: Skin is warm and dry.     Coloration: Skin is not pale.     Findings: No bruising, erythema or rash.  Neurological:     Mental Status: She is alert and oriented to person,  place, and time.     Cranial Nerves: No cranial nerve deficit.     Motor: No weakness.     Gait: Gait normal.  Psychiatric:        Mood and Affect: Mood normal.        Behavior: Behavior normal.     Labs reviewed: Recent Labs    08/05/22 1455  NA 139  K 4.0  CL 104  CO2 28  GLUCOSE 85  BUN 11  CREATININE 0.58  CALCIUM 9.7   Recent Labs    08/05/22 1455  AST 22  ALT 11  BILITOT 1.3*  PROT 6.4   Recent Labs    08/05/22 1455  WBC 7.9  NEUTROABS 3,650  HGB 14.3  HCT 42.6  MCV 97.3  PLT 277   Lab Results  Component Value Date   TSH 0.17 (L) 08/05/2022   No results found for: "HGBA1C" Lab Results  Component Value Date   CHOL 188 08/05/2022   HDL 76 08/05/2022   LDLCALC 93 08/05/2022   TRIG 96 08/05/2022   CHOLHDL 2.5 08/05/2022    Significant Diagnostic Results in last 30 days:  No results found.  Assessment/Plan  1. Nausea Negative abdominal exam - ondansetron (ZOFRAN) 4 MG tablet; Take 1 tablet (4 mg total) by mouth every 8 (eight) hours as needed for nausea or vomiting.  Dispense: 20 tablet; Refill: 0  2. COVID-19 virus infection Has stopped Paxlovid that was prescribed in ED due to nausea and vomiting - supportive care with OTC supplements as below,rest and increase fluid intake/tea/soup - zinc gluconate 50 MG tablet; Take 1 tablet (50 mg total) by mouth daily for 14 days.  Dispense: 14 tablet; Refill: 0 - Cholecalciferol (VITAMIN D3) 50 MCG (2000 UT)  capsule; Take 1 capsule (2,000 Units total) by mouth daily for 14 days.  Dispense: 14 capsule; Refill: 0 -Advised to notify provider if symptoms worsen or fail to improve  3. Upper respiratory tract infection due to COVID-19 virus Treat clinically sore throat with posterior oropharyngeal erythema  prevent worsening cough  - doxycycline (VIBRA-TABS) 100 MG tablet; Take 1 tablet (100 mg total) by mouth 2 (two) times daily for 7 days.  Dispense: 14 tablet; Refill: 0  Family/ staff Communication:  Reviewed plan of care with patient verbalized understanding  Labs/tests ordered: None   Next Appointment: Return in about 6 months (around 03/21/2023), or if symptoms worsen or fail to improve, for medical mangement of chronic issues.Caesar Bookman, NP

## 2022-10-05 ENCOUNTER — Other Ambulatory Visit: Payer: Self-pay | Admitting: Family

## 2022-10-05 DIAGNOSIS — F418 Other specified anxiety disorders: Secondary | ICD-10-CM

## 2022-11-09 ENCOUNTER — Other Ambulatory Visit: Payer: Self-pay | Admitting: Family

## 2022-11-09 DIAGNOSIS — F418 Other specified anxiety disorders: Secondary | ICD-10-CM

## 2022-11-29 ENCOUNTER — Ambulatory Visit: Payer: BC Managed Care – PPO | Admitting: Gastroenterology

## 2022-11-29 ENCOUNTER — Encounter: Payer: Self-pay | Admitting: Gastroenterology

## 2022-11-29 VITALS — BP 100/70 | HR 84 | Ht 60.0 in | Wt 125.1 lb

## 2022-11-29 DIAGNOSIS — K5904 Chronic idiopathic constipation: Secondary | ICD-10-CM

## 2022-11-29 DIAGNOSIS — K581 Irritable bowel syndrome with constipation: Secondary | ICD-10-CM | POA: Diagnosis not present

## 2022-11-29 MED ORDER — TRULANCE 3 MG PO TABS: 1.0000 | ORAL_TABLET | Freq: Every morning | ORAL | 11 refills | Status: DC

## 2022-11-29 NOTE — Patient Instructions (Signed)
We have sent the following medications to your pharmacy for you to pick up at your convenience: Trulance 3 mg daily.   _______________________________________________________  If your blood pressure at your visit was 140/90 or greater, please contact your primary care physician to follow up on this.  _______________________________________________________  If you are age 54 or older, your body mass index should be between 23-30. Your Body mass index is 24.44 kg/m. If this is out of the aforementioned range listed, please consider follow up with your Primary Care Provider.  If you are age 61 or younger, your body mass index should be between 19-25. Your Body mass index is 24.44 kg/m. If this is out of the aformentioned range listed, please consider follow up with your Primary Care Provider.   ________________________________________________________  The [facility] providers would like to encourage you to use [facility] to communicate with providers for non-urgent requests or questions.  Due to long hold times on the telephone, sending your provider a message by [facility] may be a faster and more efficient way to get a response.  Please allow 48 business hours for a response.  Please remember that this is for non-urgent requests.  _______________________________________________________

## 2022-11-29 NOTE — Progress Notes (Addendum)
 04/06/2023 Carrie Jensen [id number] 1970/04/30   HISTORY OF PRESENT ILLNESS:  This is a 52 year old female who is a patient of Dr. Derek Mound.  She is here today for yearly follow-up of her CIC and for refills on her Trulance prescription.  She is doing well with taking that daily.  Has 1-2 BMs a day that are soft to loose in consistency.  No new complaints, feeling well.  Colonoscopy 04/2021 with only internal hemorrhoids.  Past Medical History:  Diagnosis Date   Anxiety    Cancer (HCC)    Chronic idiopathic constipation    CIN II (cervical intraepithelial neoplasia II) 1998   LEEP   Depression    Endometriosis    GERD (gastroesophageal reflux disease)    Hyperthyroidism    DR. BALAN   IBS (irritable bowel syndrome)    Migraines    Plantar fasciitis of left foot    Past Surgical History:  Procedure Laterality Date   CERVICAL BIOPSY  W/ LOOP ELECTRODE EXCISION  01/18/1996   CIN II   CHOLECYSTECTOMY N/A 05/14/2021   Procedure: LAPAROSCOPIC CHOLECYSTECTOMY WITH INTRAOPERATIVE CHOLANGIOGRAM;  Surgeon: Darnell Level, MD;  Location: [facility];  Service: General;  Laterality: N/A;   COLONOSCOPY     MOHS SURGERY     OVARIAN CYSTECTOMY, PARTIAL LSO, FULGURATION OF ENDOMETRIOSIS      reports that she has never smoked. She has never used smokeless tobacco. She reports that she does not drink alcohol and does not use drugs. family history includes Cancer in her maternal aunt; Colon polyps in her brother; Diabetes in her maternal grandfather; Heart disease in her maternal grandfather, maternal grandmother, paternal grandfather, and paternal grandmother; Hypertension in her maternal grandfather; Stroke in her maternal grandmother. Allergies  Allergen Reactions   Erythromycin Nausea And Vomiting    Other Reaction(s): GI Intolerance   Other Nausea And Vomiting and Other (See Comments)    Possible reaction to Anesthesia (afterwards) = "Cold/hot feeling, cannot keep down food, N/V, and  stomach pain"      Outpatient Encounter Medications as of 11/29/2022  Medication Sig   ALPRAZolam (XANAX) 0.25 MG tablet Take 1 tablet (0.25 mg total) by mouth at bedtime as needed for anxiety.   omeprazole (PRILOSEC OTC) 20 MG tablet Take 20 mg by mouth at bedtime.   ondansetron (ZOFRAN) 4 MG tablet Take 1 tablet (4 mg total) by mouth every 8 (eight) hours as needed for nausea or vomiting.   OVER THE COUNTER MEDICATION Take 1 packet by mouth daily. Pure encapsulations dietary supplement  1/2 pack daily   Probiotic Product (PROBIOTIC-10 PO) Take 1 tablet by mouth at bedtime.   promethazine (PHENERGAN) 25 MG tablet Take 1 tablet (25 mg total) by mouth every 6 (six) hours as needed for nausea or vomiting.   SUMAtriptan (IMITREX) 50 MG tablet Take 1 tablet (50 mg total) by mouth every 2 (two) hours as needed for migraine. May repeat in 2 hours if headache persists or recurs.   vitamin C (ASCORBIC ACID) 500 MG tablet Take 500 mg by mouth at bedtime.   [DISCONTINUED] Plecanatide (TRULANCE) 3 MG TABS Take 1 tablet (3 mg total) by mouth every morning. (Patient taking differently: Take 1 tablet by mouth every morning. Last taken: Wednesday.)   [DISCONTINUED] venlafaxine (EFFEXOR) 75 MG tablet TAKE 1 TABLET BY MOUTH DAILY   [DISCONTINUED] venlafaxine XR (EFFEXOR-XR) 37.5 MG 24 hr capsule TAKE 1 CAPSULE BY MOUTH DAILY WITH BREAKFAST   [DISCONTINUED] Plecanatide (TRULANCE)  3 MG TABS Take 1 tablet (3 mg total) by mouth every morning.   No facility-administered encounter medications on file as of 11/29/2022.    REVIEW OF SYSTEMS  : All other systems reviewed and negative except where noted in the History of Present Illness.   PHYSICAL EXAM: BP 100/70 (BP Location: Left Arm, Patient Position: Sitting, Cuff Size: Normal)   Pulse 84   Ht 5' (1.524 m)   Wt 125 lb 2 oz (56.8 kg)   LMP  (LMP Unknown) Comment: takes birth control cont  SpO2 97%   BMI 24.44 kg/m  General: Well developed white female in  no acute distress Head: Normocephalic and atraumatic Eyes:  Sclerae anicteric, conjunctiva pink. Ears: Normal auditory acuity Lungs: Clear throughout to auscultation; no W/R/R. Heart: Regular rate and rhythm; no M/R/G. Musculoskeletal: Symmetrical with no gross deformities  Skin: No lesions on visible extremities Extremities: No edema  Neurological: Alert oriented x 4, grossly non-focal Psychological:  Alert and cooperative. Normal mood and affect  ASSESSMENT AND PLAN: *CIC (chronic idiopathic constipation): Well-controlled on Trulance 3 mg daily.  We will send prescription to pharmacy with enough refills for 1 year.  Follow-up in 12-18 months or sooner if needed.  CC:  Ngetich, Donalee Citrin, NP

## 2022-11-29 NOTE — Progress Notes (Signed)
I agree with the assessment and plan as outlined by Ms. Zehr. 

## 2022-12-10 ENCOUNTER — Other Ambulatory Visit: Payer: Self-pay | Admitting: Family

## 2022-12-10 DIAGNOSIS — F418 Other specified anxiety disorders: Secondary | ICD-10-CM

## 2022-12-12 NOTE — Telephone Encounter (Signed)
Patient medications have warnings. Medications pend and sent to PCP Ngetich, Donalee Citrin, NP for approval.

## 2023-01-08 ENCOUNTER — Other Ambulatory Visit: Payer: Self-pay | Admitting: Family

## 2023-01-08 DIAGNOSIS — F418 Other specified anxiety disorders: Secondary | ICD-10-CM

## 2023-01-09 NOTE — Telephone Encounter (Signed)
Patient medication has warnings. Medication pend and sent to PCP Ngetich, Donalee Citrin, NP

## 2023-01-11 ENCOUNTER — Other Ambulatory Visit: Payer: Self-pay | Admitting: Gastroenterology

## 2023-01-11 DIAGNOSIS — K581 Irritable bowel syndrome with constipation: Secondary | ICD-10-CM

## 2023-01-13 ENCOUNTER — Other Ambulatory Visit (HOSPITAL_COMMUNITY): Payer: Self-pay

## 2023-01-13 NOTE — Telephone Encounter (Signed)
Please submit for PA on 01/18/23 for Trulance

## 2023-01-19 ENCOUNTER — Other Ambulatory Visit (HOSPITAL_COMMUNITY): Payer: Self-pay

## 2023-01-20 ENCOUNTER — Other Ambulatory Visit (HOSPITAL_COMMUNITY): Payer: Self-pay

## 2023-01-20 NOTE — Telephone Encounter (Signed)
 Patient has tried and failed Linzess.

## 2023-01-20 NOTE — Telephone Encounter (Signed)
 New coverage information found- new plan requires previous failure of Lubiprostone, Linzess or Motegrity. Chart does not state any failure to these alternatives. Please advise.

## 2023-01-26 NOTE — Telephone Encounter (Signed)
 Please update PA status

## 2023-01-31 ENCOUNTER — Other Ambulatory Visit (HOSPITAL_COMMUNITY): Payer: Self-pay

## 2023-01-31 ENCOUNTER — Telehealth: Payer: Self-pay | Admitting: Pharmacy Technician

## 2023-01-31 NOTE — Telephone Encounter (Signed)
 Patient needs to try and fail all 3 medications. Per [place], CRT Lubiprostone is covered without a PA $15 copay

## 2023-01-31 NOTE — Telephone Encounter (Signed)
 Test billing results for Lubiprostone returns a $15 copay.

## 2023-02-02 NOTE — Telephone Encounter (Signed)
Left message for patient to call office.  

## 2023-02-06 NOTE — Telephone Encounter (Signed)
Left message for patient to call office.  

## 2023-02-09 ENCOUNTER — Other Ambulatory Visit (HOSPITAL_COMMUNITY): Payer: Self-pay

## 2023-02-09 NOTE — Telephone Encounter (Signed)
PT returning call. Please advise.

## 2023-02-10 MED ORDER — LUBIPROSTONE 24 MCG PO CAPS: 24.0000 ug | ORAL_CAPSULE | Freq: Two times a day (BID) | ORAL | 3 refills | Status: DC

## 2023-02-10 NOTE — Telephone Encounter (Signed)
Spoke with patient and informed her she must try Amitiza and Motegrity before we can Trulance approved. Patient voiced understanding and Amitiza 24 mcg twice daily sent to pharmacy.

## 2023-02-10 NOTE — Addendum Note (Signed)
Addended by: Mariane Duval on: 02/10/2023 08:23 AM   Modules accepted: Orders

## 2023-03-12 IMAGING — MG MM DIGITAL SCREENING BILAT W/ TOMO AND CAD
8 series · 9 of 24 positions shown · non-contrast
Comparison: Previous exam(s).

CLINICAL DATA: Screening.

EXAM:
DIGITAL SCREENING BILATERAL MAMMOGRAM WITH TOMOSYNTHESIS AND CAD
TECHNIQUE: Bilateral screening digital craniocaudal and mediolateral oblique
mammograms were obtained. Bilateral screening digital breast
tomosynthesis was performed. The images were evaluated with
computer-aided detection.

[R CC synth-2D]
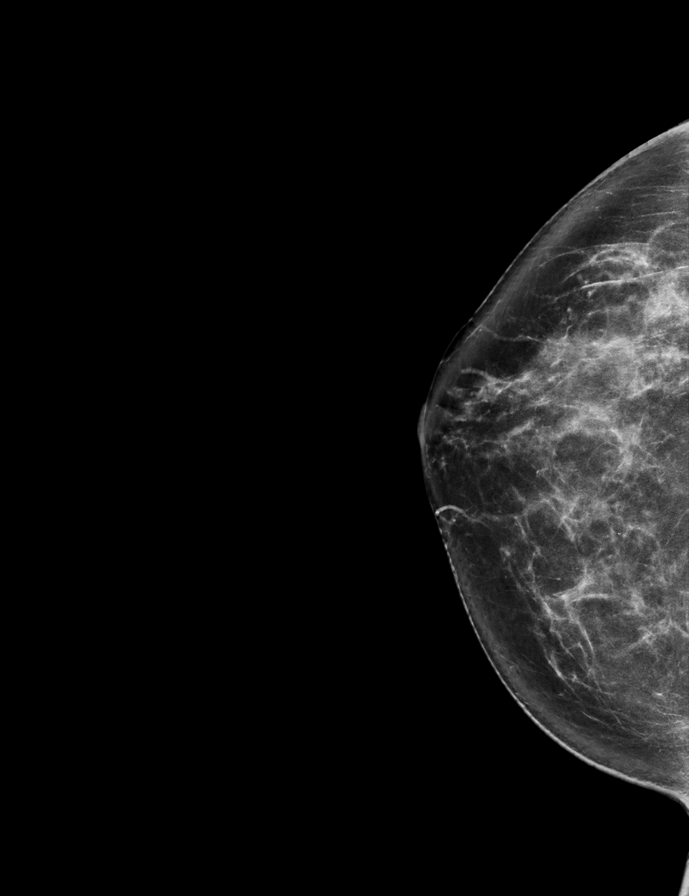

[L CC synth-2D]
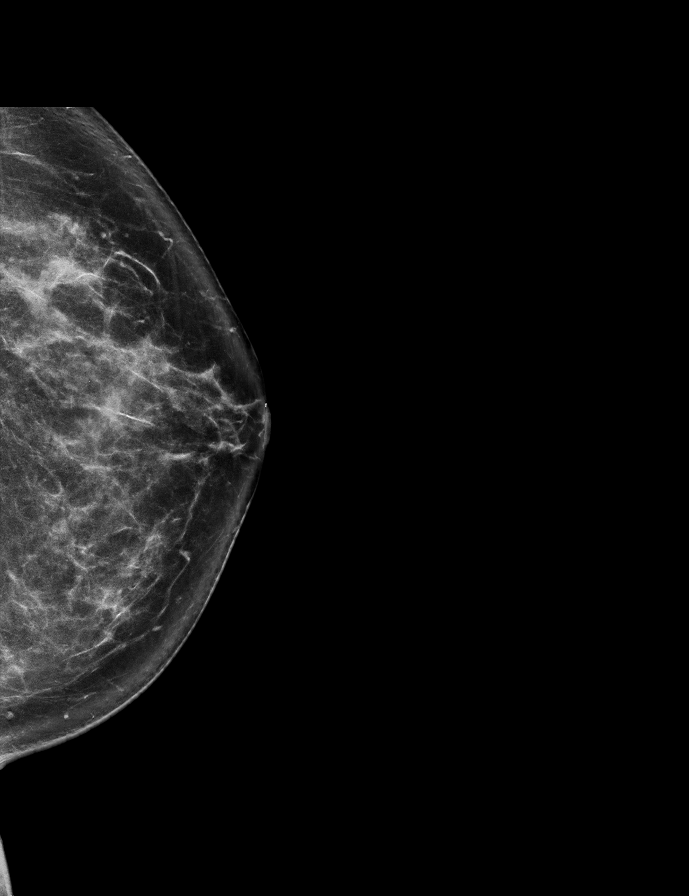

[L MLO synth-2D]
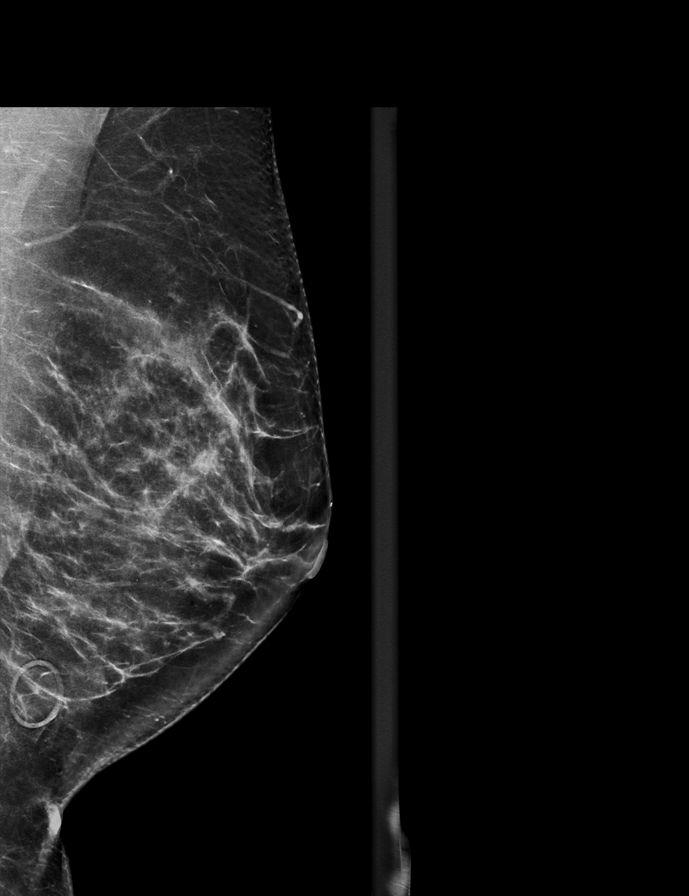

[R MLO synth-2D]
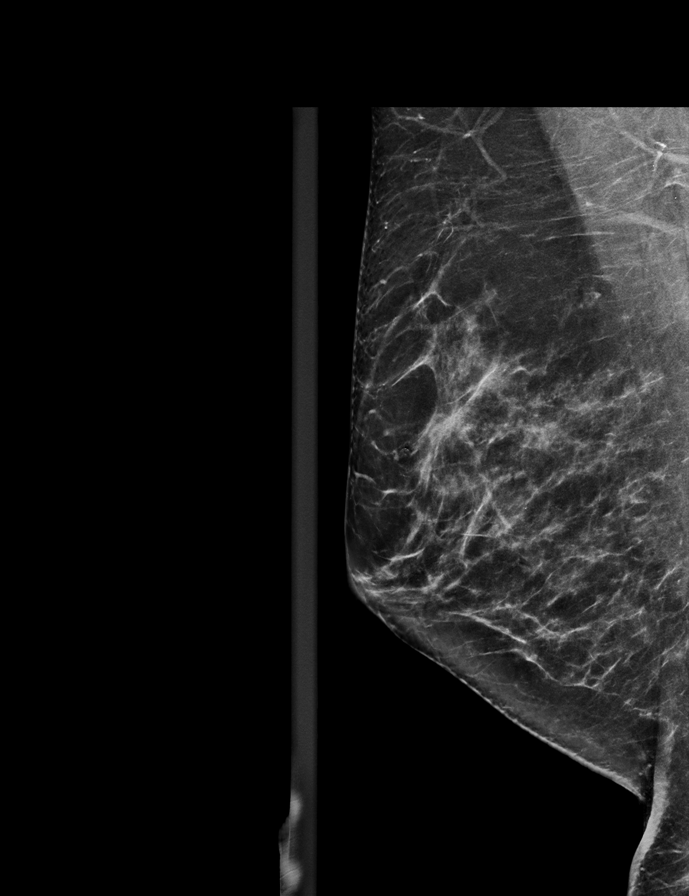

[L MLO tomo · 2 of 70 frames shown]
[frame 23/70]
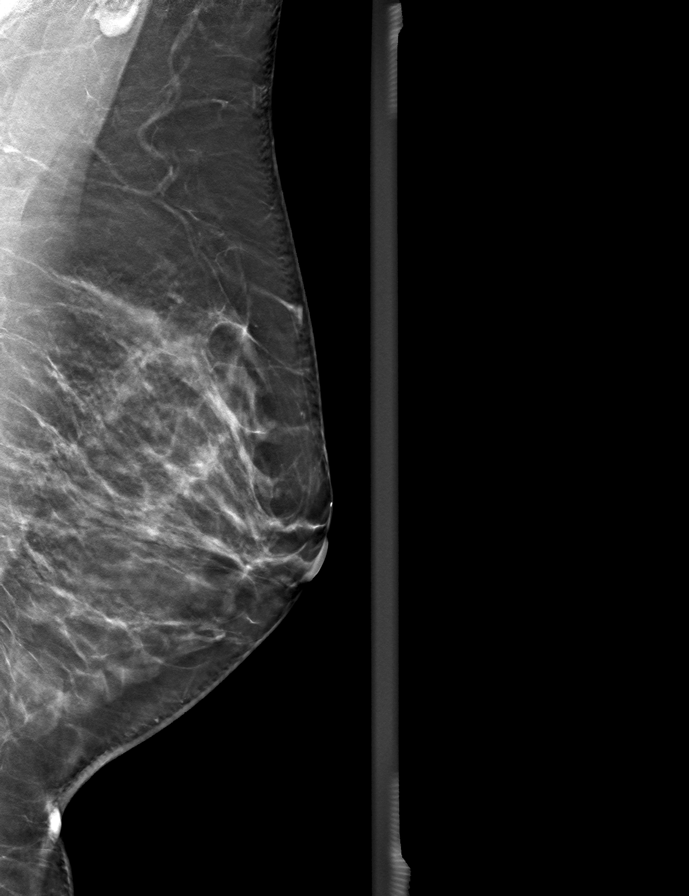
[frame 35/70]
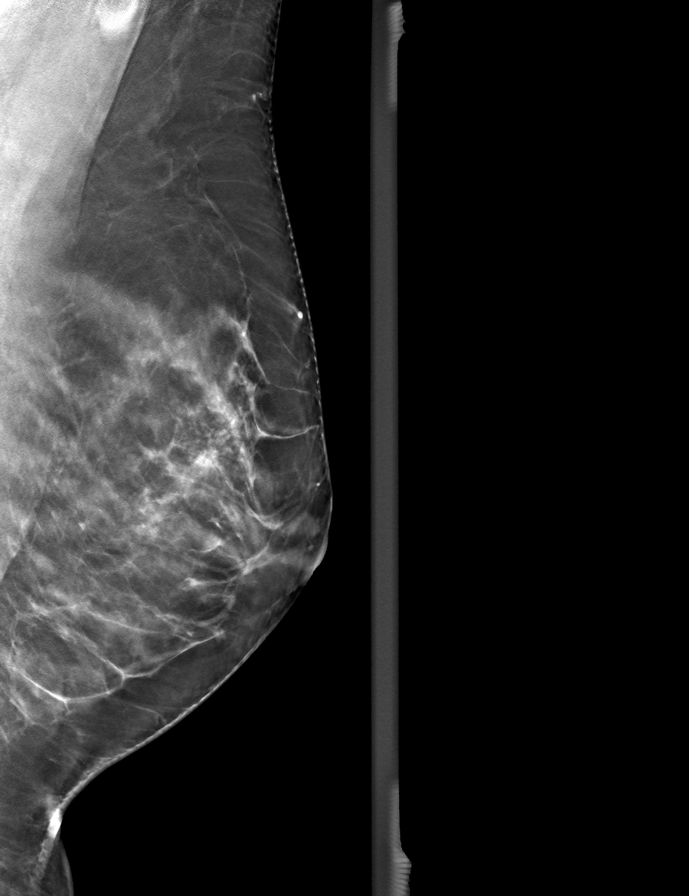

[R CC tomo · tomo slice 37/73.0]
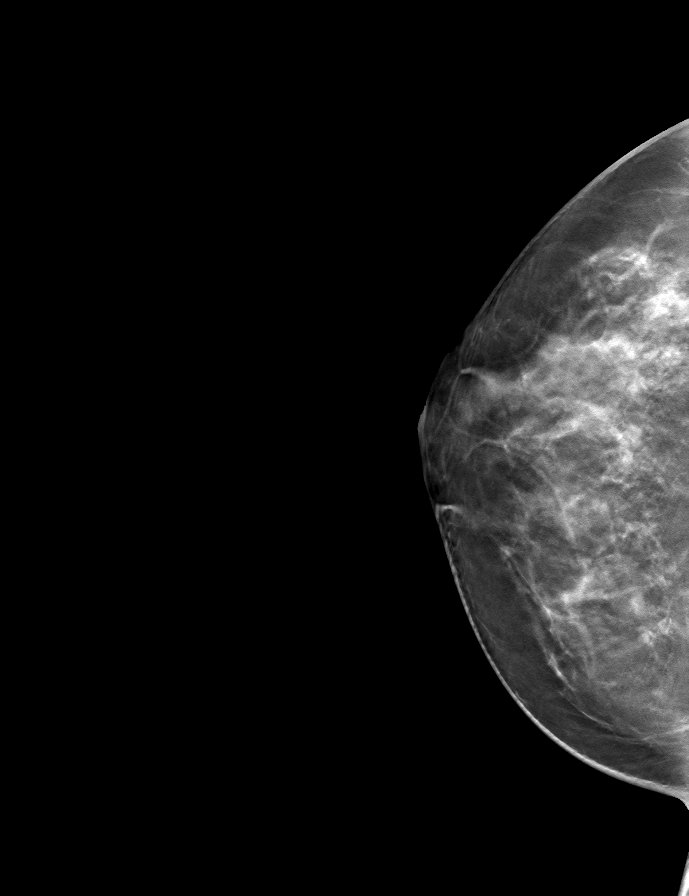

[L CC tomo · tomo slice 37/74.0]
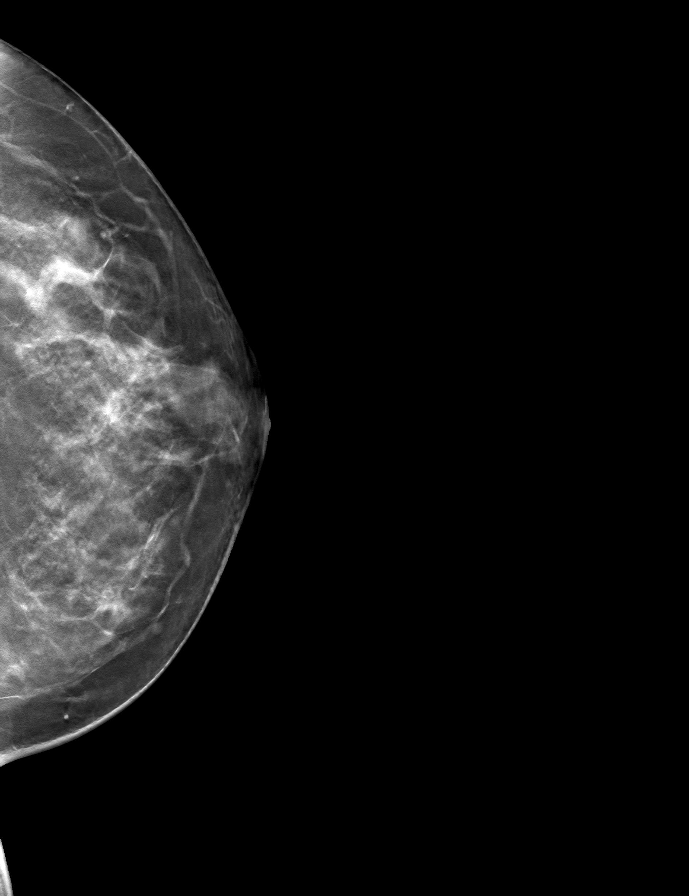

[R MLO tomo · tomo slice 36/71.0]
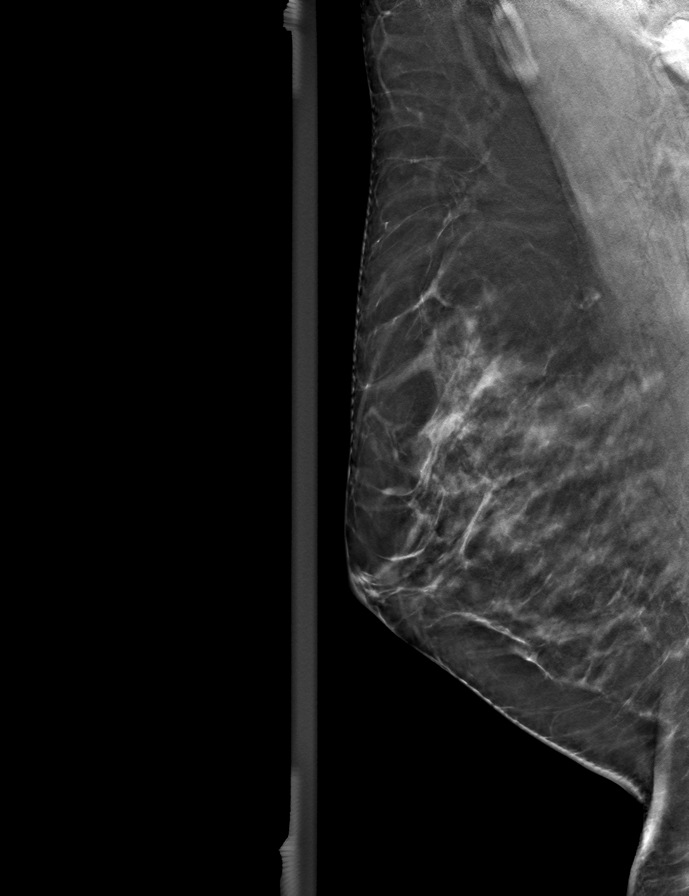

[9 of 24 positions shown; findings below may reference images not displayed]

ACR Breast Density Category c: The breast tissue is heterogeneously
dense, which may obscure small masses.
FINDINGS: In the left breast, a possible mass warrants further evaluation. In
the right breast, no findings suspicious for malignancy.
IMPRESSION: Further evaluation is suggested for a possible mass in the left
breast.

RECOMMENDATION:
Diagnostic mammogram and possibly ultrasound of the left breast.
(Code:C3-P-XXB)

The patient will be contacted regarding the findings, and additional
imaging will be scheduled.

BI-RADS CATEGORY  0: Incomplete. Need additional imaging evaluation
and/or prior mammograms for comparison.

## 2023-03-20 ENCOUNTER — Other Ambulatory Visit (HOSPITAL_COMMUNITY): Payer: Self-pay

## 2023-03-20 ENCOUNTER — Telehealth: Payer: Self-pay

## 2023-03-20 MED ORDER — PRUCALOPRIDE SUCCINATE 2 MG PO TABS: 2.0000 mg | ORAL_TABLET | Freq: Every day | ORAL | 1 refills | Status: DC

## 2023-03-20 NOTE — Addendum Note (Signed)
 Addended by: Loretha Stapler on: 03/20/2023 11:06 AM   Modules accepted: Orders

## 2023-03-20 NOTE — Telephone Encounter (Signed)
 Pharmacy Patient Advocate Encounter   Received notification from CoverMyMeds that prior authorization for Motegrity 2 mg tablets is required/requested.   Insurance verification completed.   The patient is insured through [facility] .   Per test claim: PA required; PA submitted to above mentioned insurance via CoverMyMeds Key/confirmation #/EOC [id number] Status is pending

## 2023-03-20 NOTE — Addendum Note (Signed)
 Addended by: Loretha Stapler on: 03/20/2023 01:48 PM   Modules accepted: Orders

## 2023-03-23 ENCOUNTER — Other Ambulatory Visit (HOSPITAL_COMMUNITY): Payer: Self-pay

## 2023-03-24 ENCOUNTER — Encounter (HOSPITAL_BASED_OUTPATIENT_CLINIC_OR_DEPARTMENT_OTHER): Payer: Self-pay | Admitting: Radiology

## 2023-03-24 ENCOUNTER — Ambulatory Visit (HOSPITAL_BASED_OUTPATIENT_CLINIC_OR_DEPARTMENT_OTHER)
Admission: RE | Admit: 2023-03-24 | Discharge: 2023-03-24 | Disposition: A | Discharge status: Home / Self Care | Source: Ambulatory Visit | Attending: Nurse Practitioner | Admitting: Nurse Practitioner

## 2023-03-24 DIAGNOSIS — Z1231 Encounter for screening mammogram for malignant neoplasm of breast: Secondary | ICD-10-CM | POA: Insufficient documentation

## 2023-03-24 NOTE — Telephone Encounter (Signed)
 Please submit Motegrity. Insurance information provided below.

## 2023-03-27 NOTE — Telephone Encounter (Signed)
Patient is calling to check on medication status.

## 2023-03-29 NOTE — Telephone Encounter (Signed)
 Resubmitted to [facility] as requested

## 2023-04-03 ENCOUNTER — Other Ambulatory Visit (HOSPITAL_COMMUNITY): Payer: Self-pay

## 2023-04-04 ENCOUNTER — Other Ambulatory Visit (HOSPITAL_COMMUNITY): Payer: Self-pay

## 2023-04-04 NOTE — Telephone Encounter (Signed)
 Patient has been notified. What are her options now?

## 2023-04-04 NOTE — Telephone Encounter (Signed)
 PA request has been Denied. New Encounter has been or will be created for follow up. For additional info see Pharmacy Prior Auth telephone encounter from 03-20-2023.

## 2023-04-04 NOTE — Telephone Encounter (Signed)
 Pharmacy Patient Advocate Encounter  Received notification from [employer] that Prior Authorization for Motegrity 2 mg tablets has been DENIED.  Full denial letter will be uploaded to the media tab. See denial reason below.  The requested medication can only be considered for approval if there is a diagnosis of Chronic Idiopathic Constipation as documented in the patient medical record.   PA #/Case ID/Reference #: [id number]

## 2023-04-05 ENCOUNTER — Telehealth: Payer: Self-pay | Admitting: Pharmacist

## 2023-04-05 NOTE — Telephone Encounter (Signed)
 Left message for the patient to call back.

## 2023-04-05 NOTE — Telephone Encounter (Signed)
 To proceed with an appeal for Motegrity, the patient must have documented chronic idiopathic constipation (CIC). Currently, the available documentation only indicates a diagnosis of irritable bowel syndrome with constipation (IBS-C), which is distinct from CIC. Please advise on how you would like to proceed.  Thank you, Dellie Burns, PharmD Clinical Pharmacist  [place]  Direct Dial: [phone]

## 2023-04-05 NOTE — Telephone Encounter (Signed)
 Information has been sent to clinical pharmacist for appeals review. It may take 5-7 days to prepare the necessary documentation to request the appeal from the insurance.

## 2023-04-06 ENCOUNTER — Encounter: Payer: Self-pay | Admitting: Gastroenterology

## 2023-04-06 DIAGNOSIS — K5904 Chronic idiopathic constipation: Secondary | ICD-10-CM | POA: Insufficient documentation

## 2023-04-06 NOTE — Telephone Encounter (Signed)
 Appeal has been submitted for Motegrity. Will advise when response is received, please be advised that most companies may take 30 days to make a decision. Appeal letter and supporting documentation have been faxed to [phone] on 04/06/2023 @10 :50am.  Thank you, Dellie Burns, PharmD Clinical Pharmacist  [place]  Direct Dial: [phone]

## 2023-04-06 NOTE — Telephone Encounter (Signed)
How do you want to proceed?

## 2023-04-10 ENCOUNTER — Telehealth: Payer: Self-pay

## 2023-04-10 ENCOUNTER — Other Ambulatory Visit (HOSPITAL_COMMUNITY): Payer: Self-pay

## 2023-04-10 ENCOUNTER — Other Ambulatory Visit: Payer: Self-pay

## 2023-04-10 MED ORDER — LINACLOTIDE 72 MCG PO CAPS: 72.0000 ug | ORAL_CAPSULE | Freq: Every day | ORAL | 3 refills | Status: DC

## 2023-04-10 NOTE — Telephone Encounter (Signed)
 Additional information has been requested from the patient's insurance in order to proceed with the appeal request. Requested information has been sent, or form has been filled out and faxed back to [phone]

## 2023-04-10 NOTE — Telephone Encounter (Signed)
 Pharmacy Patient Advocate Encounter   Received notification from CoverMyMeds that prior authorization for LINZESS 72 MCG CAPSULE is required/requested.   Insurance verification completed.   The patient is insured through [facility] .   Per test claim: PA required; PA submitted to above mentioned insurance via Prompt PA Key/confirmation #/EOC [id number] Status is pending

## 2023-04-11 ENCOUNTER — Other Ambulatory Visit (HOSPITAL_COMMUNITY): Payer: Self-pay

## 2023-04-11 NOTE — Telephone Encounter (Signed)
 Pharmacy Patient Advocate Encounter  Received notification from [employer] that Prior Authorization for [facility] 72 MCG CAPSULE has been APPROVED from 04-10-2023 to 04-08-2024   PA #/Case ID/Reference #: [id number]

## 2023-04-12 NOTE — Telephone Encounter (Signed)
 Per insurance, appeal for Motegrity has been approved:    Thank you, Dellie Burns, PharmD Clinical Pharmacist  [place]  Direct Dial: [phone]

## 2023-04-12 NOTE — Telephone Encounter (Signed)
 Called the patient and left a message with this information.

## 2023-07-07 ENCOUNTER — Other Ambulatory Visit: Payer: Self-pay | Admitting: Family

## 2023-07-07 DIAGNOSIS — F418 Other specified anxiety disorders: Secondary | ICD-10-CM

## 2023-07-07 NOTE — Telephone Encounter (Signed)
 Pt needs appt with PCP prior to any additional refill

## 2023-07-07 NOTE — Telephone Encounter (Signed)
 Patient has request refill on medications Effexor  75mg , and 37.5mg . Medications have High Risk Warnings. Pend and sent to [facility], NP due to PCP Ngetich, Elijio [place], NP being out of office.

## 2023-08-09 ENCOUNTER — Other Ambulatory Visit: Payer: Self-pay

## 2023-08-09 DIAGNOSIS — F418 Other specified anxiety disorders: Secondary | ICD-10-CM

## 2023-08-09 MED ORDER — VENLAFAXINE HCL 75 MG PO TABS: 75.0000 mg | ORAL_TABLET | Freq: Every day | ORAL | 0 refills | Status: DC

## 2023-08-11 ENCOUNTER — Other Ambulatory Visit: Payer: Self-pay

## 2023-08-11 ENCOUNTER — Other Ambulatory Visit: Payer: Self-pay | Admitting: Gastroenterology

## 2023-08-11 DIAGNOSIS — F418 Other specified anxiety disorders: Secondary | ICD-10-CM

## 2023-08-11 MED ORDER — VENLAFAXINE HCL ER 37.5 MG PO CP24: 37.5000 mg | ORAL_CAPSULE | Freq: Every day | ORAL | 0 refills | Status: DC

## 2023-09-10 ENCOUNTER — Other Ambulatory Visit: Payer: Self-pay | Admitting: Family

## 2023-09-10 DIAGNOSIS — F418 Other specified anxiety disorders: Secondary | ICD-10-CM

## 2023-09-11 ENCOUNTER — Other Ambulatory Visit: Payer: Self-pay | Admitting: *Deleted

## 2023-09-11 DIAGNOSIS — F418 Other specified anxiety disorders: Secondary | ICD-10-CM

## 2023-09-11 MED ORDER — VENLAFAXINE HCL ER 37.5 MG PO CP24
37.5000 mg | ORAL_CAPSULE | Freq: Every day | ORAL | 0 refills | Status: DC
Start: 2023-09-11 — End: 2023-09-22

## 2023-09-11 NOTE — Telephone Encounter (Signed)
 Copied from CRM [id number]. Topic: Clinical - Medication Question >> Sep 11, 2023  9:44 AM Diannia H wrote: Reason for CRM: Patient called and she made an appointment but wont have enough of her venlafaxine  XR (EFFEXOR -XR) 37.5 MG 24 hr capsule to last until then. Could she get a rx sent to [facility] [id number] - [place], [place] - [address] [place] [address] Phone: [phone] Fax: [phone] Hours: Not open 24 hours  Until she can come in to be seen? Could you assist?

## 2023-09-11 NOTE — Telephone Encounter (Signed)
 Patient requested refill.  Patient scheduled an appointment for 9/8 Pended Rx and sent to [facility] for approval due to HIGH ALERT Warning.

## 2023-09-11 NOTE — Telephone Encounter (Signed)
 High Risk Warning Populated when attempting to refill, I will send to Provider for further review    Patient has been sent a MyChart message and has been called to make an follow-up appointment.

## 2023-09-20 ENCOUNTER — Encounter: Payer: Self-pay | Admitting: Nurse Practitioner

## 2023-09-20 ENCOUNTER — Ambulatory Visit (INDEPENDENT_AMBULATORY_CARE_PROVIDER_SITE_OTHER): Admitting: Nurse Practitioner

## 2023-09-20 ENCOUNTER — Other Ambulatory Visit (HOSPITAL_COMMUNITY)
Admission: RE | Admit: 2023-09-20 | Discharge: 2023-09-20 | Disposition: A | Discharge status: Home / Self Care | Source: Ambulatory Visit | Attending: Nurse Practitioner | Admitting: Nurse Practitioner

## 2023-09-20 VITALS — BP 104/62 | HR 76 | Ht 60.05 in | Wt 126.0 lb

## 2023-09-20 DIAGNOSIS — Z124 Encounter for screening for malignant neoplasm of cervix: Secondary | ICD-10-CM

## 2023-09-20 DIAGNOSIS — Z01419 Encounter for gynecological examination (general) (routine) without abnormal findings: Secondary | ICD-10-CM | POA: Diagnosis present

## 2023-09-20 DIAGNOSIS — Z79890 Hormone replacement therapy (postmenopausal): Secondary | ICD-10-CM | POA: Diagnosis not present

## 2023-09-20 DIAGNOSIS — R61 Generalized hyperhidrosis: Secondary | ICD-10-CM

## 2023-09-20 DIAGNOSIS — Z1331 Encounter for screening for depression: Secondary | ICD-10-CM

## 2023-09-20 DIAGNOSIS — F419 Anxiety disorder, unspecified: Secondary | ICD-10-CM | POA: Diagnosis not present

## 2023-09-20 DIAGNOSIS — R110 Nausea: Secondary | ICD-10-CM | POA: Diagnosis not present

## 2023-09-20 DIAGNOSIS — R519 Headache, unspecified: Secondary | ICD-10-CM

## 2023-09-20 MED ORDER — SUMATRIPTAN SUCCINATE 50 MG PO TABS: 50.0000 mg | ORAL_TABLET | ORAL | 6 refills | Status: AC | PRN

## 2023-09-20 MED ORDER — ESTRADIOL 0.025 MG/24HR TD PTWK: 0.0250 mg | MEDICATED_PATCH | TRANSDERMAL | 1 refills | Status: DC

## 2023-09-20 MED ORDER — ONDANSETRON HCL 4 MG PO TABS: 4.0000 mg | ORAL_TABLET | Freq: Three times a day (TID) | ORAL | 0 refills | Status: AC | PRN

## 2023-09-20 MED ORDER — ALPRAZOLAM 0.25 MG PO TABS: 0.2500 mg | ORAL_TABLET | Freq: Every evening | ORAL | 0 refills | Status: AC | PRN

## 2023-09-20 MED ORDER — PROGESTERONE MICRONIZED 100 MG PO CAPS
100.0000 mg | ORAL_CAPSULE | Freq: Every day | ORAL | 1 refills | Status: DC
Start: 2023-09-20 — End: 2023-10-18

## 2023-09-20 NOTE — Progress Notes (Signed)
 Carrie Jensen Jensen 29-Mar-1970 [id number]   History:  53 y.o. G0 presents for annual exam. Postmenopausal - no HRT, no bleeding.  Complains of hot flashes, night sweats and mood changes. Doing OTC supplement, Effexor . Recently decreased Effexor  dose. Interested in HRT. 1998 CIN-2 LEEP, subsequent paps normal.  History of ovarian cystectomy, basal cell carcinoma, anxiety/depression, migraines, IBS, and hyperthyroidism. Xanax  rarely for anxiety. Imitrex  as needed for migraines, take occasionally.   Gynecologic History No LMP recorded (lmp unknown). Patient is postmenopausal.   Contraception/Family planning: post menopausal status Sexually active: No  Health Maintenance Last Pap: 03/16/2022. Results were: ASCUS negative HPV Last mammogram: 03/24/2023. Results were: Normal Last colonoscopy: 05/07/2021. Results were: Normal, 10-year recall Last Dexa: Never     09/20/2023    4:09 PM  Depression screen PHQ 2/9  Decreased Interest 0  Down, Depressed, Hopeless 1  PHQ - 2 Score 1     Past medical history, past surgical history, family history and social history were all reviewed and documented in the EPIC chart. Married. 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   ROS:  A ROS was performed and pertinent positives and negatives are included.  Exam:  Vitals:   09/20/23 1613  BP: 104/62  Pulse: 76  SpO2: 97%  Weight: 126 lb (57.2 kg)  Height: 5' 0.05 (1.525 m)      Body mass index is 24.57 kg/m.  General appearance:  Normal Thyroid :  Symmetrical, normal in size, without palpable masses or nodularity. Respiratory  Auscultation:  Clear without wheezing or rhonchi Cardiovascular  Auscultation:  Regular rate, without rubs, murmurs or gallops  Edema/varicosities:  Not grossly evident Abdominal  Soft,nontender, without masses, guarding or rebound.  Liver/spleen:  No organomegaly noted  Hernia:  None appreciated  Skin  Inspection:  Grossly normal   Breasts: Examined lying and  sitting.   Right: Without masses, retractions, discharge or axillary adenopathy.   Left: Without masses, retractions, discharge or axillary adenopathy. Pelvic: External genitalia:  no lesions              Urethra:  normal appearing urethra with no masses, tenderness or lesions              Bartholins and Skenes: normal                 Vagina: normal appearing vagina with normal color and discharge, no lesions. Atrophic changes              Cervix: no lesions Bimanual Exam:  Uterus:  no masses or tenderness              Adnexa: no mass, fullness, tenderness              Rectovaginal: Deferred              Anus:  normal, no lesions  [name] Pa, CMA present as chaperone.   Assessment/Plan:  53 y.o. G0 for annual exam.   Well female exam with routine gynecological exam - Education provided on SBEs, importance of preventative screenings, current guidelines, high calcium diet, regular exercise, and multivitamin daily. Labs through work.  Screening for cervical cancer - Plan: Cytology - PAP( [place]). 1998 CIN-2 LEEP, subsequent paps normal. 2021/2024 ASCUS neg HPV. Pap today per guidelines.   Anxiety - Plan: ALPRAZolam  (XANAX ) 0.25 MG tablet as needed. Takes rarely.   Postmenopausal hormone therapy - Plan: estradiol  (CLIMARA ) 0.025 mg/24hr patch weekly, progesterone  (PROMETRIUM ) 100 MG capsule nightly. Educated on risks, benefits and proper use.  Generalized headaches - Plan: SUMAtriptan  (IMITREX ) 50 MG tablet every 2 (two) hours as needed for migraine. May repeat in 2 hours if headache persists or recurs.  Nausea - Plan: ondansetron  (ZOFRAN ) 4 MG tablet as needed.   Screening for breast cancer - Normal mammogram history. Continue annual screenings.  Normal breast exam today.  Screening for colon cancer - Screening colonoscopy last year. Had severe GI symptoms following and ended up having gallbladder removed. Refuses further colonoscopies.   Return in about 4 weeks (around  10/18/2023) for Med follow up, 1 year for annual. .       Annabella [name] [facility], 4:36 PM 09/20/2023

## 2023-09-21 DIAGNOSIS — F418 Other specified anxiety disorders: Secondary | ICD-10-CM

## 2023-09-22 ENCOUNTER — Other Ambulatory Visit: Payer: Self-pay | Admitting: Family

## 2023-09-22 DIAGNOSIS — F418 Other specified anxiety disorders: Secondary | ICD-10-CM

## 2023-09-22 MED ORDER — VENLAFAXINE HCL 75 MG PO TABS
75.0000 mg | ORAL_TABLET | Freq: Every day | ORAL | 1 refills | Status: DC
Start: 2023-09-22 — End: 2023-10-02

## 2023-09-22 MED ORDER — VENLAFAXINE HCL ER 37.5 MG PO CP24
37.5000 mg | ORAL_CAPSULE | Freq: Every day | ORAL | 1 refills | Status: DC
Start: 2023-09-22 — End: 2023-10-02

## 2023-09-22 NOTE — Telephone Encounter (Signed)
 We only have the Venlafaxine  75mg  as once daily. Is this something you want us  to update in patient's current medication List?  Pended the Venlafaxine  37.5 for approval due to HIGH ALERT Warning.

## 2023-09-25 ENCOUNTER — Ambulatory Visit: Admitting: Family

## 2023-09-25 ENCOUNTER — Ambulatory Visit: Payer: Self-pay | Admitting: Nurse Practitioner

## 2023-09-25 LAB — CYTOLOGY - PAP
Comment: NEGATIVE
Diagnosis: NEGATIVE
Diagnosis: REACTIVE
High risk HPV: NEGATIVE

## 2023-10-02 ENCOUNTER — Encounter: Admitting: Family

## 2023-10-02 ENCOUNTER — Ambulatory Visit: Admitting: Family

## 2023-10-02 ENCOUNTER — Encounter: Payer: Self-pay | Admitting: Family

## 2023-10-02 VITALS — BP 104/70 | HR 80 | Temp 97.8°F | Resp 18 | Ht 60.5 in | Wt 128.0 lb

## 2023-10-02 DIAGNOSIS — Z13220 Encounter for screening for lipoid disorders: Secondary | ICD-10-CM

## 2023-10-02 DIAGNOSIS — Z23 Encounter for immunization: Secondary | ICD-10-CM | POA: Diagnosis not present

## 2023-10-02 DIAGNOSIS — E0590 Thyrotoxicosis, unspecified without thyrotoxic crisis or storm: Secondary | ICD-10-CM | POA: Diagnosis not present

## 2023-10-02 DIAGNOSIS — F418 Other specified anxiety disorders: Secondary | ICD-10-CM

## 2023-10-02 MED ORDER — VENLAFAXINE HCL 75 MG PO TABS: 75.0000 mg | ORAL_TABLET | Freq: Every day | ORAL | 1 refills | Status: AC

## 2023-10-02 MED ORDER — VENLAFAXINE HCL ER 37.5 MG PO CP24
37.5000 mg | ORAL_CAPSULE | Freq: Every day | ORAL | 1 refills | Status: AC
Start: 2023-10-02 — End: ?

## 2023-10-02 NOTE — Progress Notes (Signed)
 Provider: Roxan Plough FNP-C  Garlan Drewes, Roxan BROCKS, NP  Patient Care Team: Kerri Asche, Roxan BROCKS, NP as PCP - General (Family Medicine)  Extended Emergency Contact Information Primary Emergency Contact: Compton,Wally Address: [address]          [place], [place] [address] [place] Mobile Phone: [phone] Relation: Spouse  Code Status:  Full Code  Goals of care: Advanced Directive information    10/02/2023    2:45 PM  Advanced Directives  Does Patient Have a Medical Advance Directive? No  Would patient like information on creating a medical advance directive? No - Patient declined     Chief Complaint  Patient presents with   Medication Management    Venlafaxine  dose reduction.    Discussed the use of AI scribe software for clinical note transcription with the patient, who gave verbal consent to proceed.  History of Present Illness   Carrie Jensen is a 53 year old female who presents for medication management following a dose adjustment of venlafaxine .  She is currently on a reduced dose of venlafaxine , taking 75 mg plus half of a 75 mg tablet daily, due to the unavailability of the 37.5 mg dose at the pharmacy. She is stable on this regimen without any worsening of her depression symptoms.  She discontinued Amitiza  due to ineffectiveness and is now taking Linzess  72 mcg for bowel management. She uses Zofran  4 mg as needed for nausea and omeprazole as needed for gastric issues. Additionally, she takes a probiotic, vitamin C, zinc , and a pure vitamin pack.  Her current medications also include alprazolam  0.25 mg as needed, an estradiol  patch, progesterone  100 mg, and Imitrex  for migraines. She maintains an active lifestyle and is mindful of her mental health, particularly with the approaching winter season, which she notes can affect her mood.   Past Medical History:  Diagnosis Date   Anxiety    Cancer (HCC)    Chronic idiopathic  constipation    CIN II (cervical intraepithelial neoplasia II) 1998   LEEP   Depression    Endometriosis    GERD (gastroesophageal reflux disease)    Hyperthyroidism    DR. BALAN   IBS (irritable bowel syndrome)    Migraines    Plantar fasciitis of left foot    Past Surgical History:  Procedure Laterality Date   CERVICAL BIOPSY  W/ LOOP ELECTRODE EXCISION  01/18/1996   CIN II   CHOLECYSTECTOMY N/A 05/14/2021   Procedure: LAPAROSCOPIC CHOLECYSTECTOMY WITH INTRAOPERATIVE CHOLANGIOGRAM;  Surgeon: Eletha Boas, MD;  Location: [facility];  Service: General;  Laterality: N/A;   COLONOSCOPY     MOHS SURGERY     OVARIAN CYSTECTOMY, PARTIAL LSO, FULGURATION OF ENDOMETRIOSIS      Allergies  Allergen Reactions   Erythromycin Nausea And Vomiting    Other Reaction(s): GI Intolerance   Other Nausea And Vomiting and Other (See Comments)    Possible reaction to Anesthesia (afterwards) = Cold/hot feeling, cannot keep down food, N/V, and stomach pain    Outpatient Encounter Medications as of 10/02/2023  Medication Sig   ALPRAZolam  (XANAX ) 0.25 MG tablet Take 1 tablet (0.25 mg total) by mouth at bedtime as needed for anxiety.   estradiol  (CLIMARA ) 0.025 mg/24hr patch Place 1 patch (0.025 mg total) onto the skin once a week.   LINZESS  72 MCG capsule TAKE 1 CAPSULE BY MOUTH DAILY BEFORE BREAKFAST   omeprazole (PRILOSEC OTC) 20 MG tablet Take 20 mg by mouth at bedtime. (Patient  taking differently: Take 20 mg by mouth as needed.)   ondansetron  (ZOFRAN ) 4 MG tablet Take 1 tablet (4 mg total) by mouth every 8 (eight) hours as needed for nausea or vomiting.   OVER THE COUNTER MEDICATION Take 1 packet by mouth daily. Pure encapsulations dietary supplement  1/2 pack daily   Probiotic Product (PROBIOTIC-10 PO) Take 1 tablet by mouth at bedtime.   progesterone  (PROMETRIUM ) 100 MG capsule Take 1 capsule (100 mg total) by mouth daily.   SUMAtriptan  (IMITREX ) 50 MG tablet Take 1 tablet (50 mg total) by mouth  every 2 (two) hours as needed for migraine. May repeat in 2 hours if headache persists or recurs.   [DISCONTINUED] venlafaxine  (EFFEXOR ) 75 MG tablet Take 1 tablet (75 mg total) by mouth daily.   venlafaxine  (EFFEXOR ) 75 MG tablet Take 1 tablet (75 mg total) by mouth daily.   venlafaxine  XR (EFFEXOR -XR) 37.5 MG 24 hr capsule Take 1 capsule (37.5 mg total) by mouth daily with breakfast.   vitamin C (ASCORBIC ACID) 500 MG tablet Take 500 mg by mouth at bedtime.   [DISCONTINUED] lubiprostone  (AMITIZA ) 24 MCG capsule Take 1 capsule (24 mcg total) by mouth 2 (two) times daily with a meal. (Patient not taking: Reported on 10/02/2023)   [DISCONTINUED] promethazine  (PHENERGAN ) 25 MG tablet Take 1 tablet (25 mg total) by mouth every 6 (six) hours as needed for nausea or vomiting. (Patient not taking: Reported on 10/02/2023)   [DISCONTINUED] Prucalopride Succinate  (MOTEGRITY ) 2 MG TABS Take 1 tablet (2 mg total) by mouth daily. (Patient not taking: Reported on 10/02/2023)   [DISCONTINUED] venlafaxine  XR (EFFEXOR -XR) 37.5 MG 24 hr capsule Take 1 capsule (37.5 mg total) by mouth daily with breakfast. (Patient not taking: Reported on 10/02/2023)   No facility-administered encounter medications on file as of 10/02/2023.    Review of Systems  Constitutional:  Negative for appetite change, chills, fatigue, fever and unexpected weight change.  HENT:  Negative for trouble swallowing.   Eyes:  Negative for pain, discharge, redness, itching and visual disturbance.  Respiratory:  Negative for cough, chest tightness, shortness of breath and wheezing.   Cardiovascular:  Negative for chest pain, palpitations and leg swelling.  Gastrointestinal:  Positive for constipation. Negative for abdominal distention, abdominal pain, blood in stool, diarrhea, nausea and vomiting.  Musculoskeletal:  Negative for arthralgias, back pain, gait problem, joint swelling, myalgias, neck pain and neck stiffness.  Skin:  Negative for color  change, pallor and rash.  Psychiatric/Behavioral:  Negative for agitation, behavioral problems, confusion, hallucinations, self-injury, sleep disturbance and suicidal ideas. The patient is not nervous/anxious.        Depression symptoms have improved     Immunization History  Administered Date(s) Administered   Influenza-Unspecified 11/02/2022   PNEUMOCOCCAL CONJUGATE-20 10/02/2023   Tdap 08/05/2022   Pertinent  Health Maintenance Due  Topic Date Due   Influenza Vaccine  11/01/2023 (Originally 08/18/2023)   Mammogram  03/23/2025   Colonoscopy  05/08/2031      05/14/2021    8:00 PM 05/15/2021    8:45 AM 08/05/2022    2:06 PM 09/21/2022   10:47 AM 09/20/2023    4:09 PM  Fall Risk  Falls in the past year?   0 0 0  Was there an injury with Fall?   0  0  Fall Risk Category Calculator   0  0  (RETIRED) Patient Fall Risk Level Low fall risk  Low fall risk      Patient at Risk for  Falls Due to   No Fall Risks  No Fall Risks  Fall risk Follow up   Falls evaluation completed  Falls evaluation completed     Data saved with a previous flowsheet row definition   Functional Status Survey:    Vitals:   10/02/23 1449  BP: 104/70  Pulse: 80  Resp: 18  Temp: 97.8 F (36.6 C)  SpO2: 97%  Weight: 128 lb (58.1 kg)  Height: 5' 0.5 (1.537 m)   Body mass index is 24.59 kg/m. Physical Exam  VITALS: T- 97.8, P- 80, BP- 104/70, SaO2- 97% MEASUREMENTS: Weight- 128. GENERAL: Alert, cooperative, well developed, no acute distress. HEENT: Normocephalic, normal oropharynx, moist mucous membranes. CHEST: Clear to auscultation bilaterally, no wheezes, rhonchi, or crackles. CARDIOVASCULAR: Normal heart rate and rhythm, S1 and S2 normal without murmurs. ABDOMEN: Soft, non-tender, non-distended, without organomegaly, normal bowel sounds. EXTREMITIES: No cyanosis or edema. NEUROLOGICAL: Cranial nerves grossly intact, moves all extremities without gross motor or sensory deficit.  PSYCHIATRY/BEHAVIORAL:  Mood stable   Labs reviewed: No results for input(s): NA, K, CL, CO2, GLUCOSE, BUN, CREATININE, CALCIUM, MG, PHOS in the last 8760 hours. No results for input(s): AST, ALT, ALKPHOS, BILITOT, PROT, ALBUMIN in the last 8760 hours. No results for input(s): WBC, NEUTROABS, HGB, HCT, MCV, PLT in the last 8760 hours. Lab Results  Component Value Date   TSH 0.17 (L) 08/05/2022   No results found for: HGBA1C Lab Results  Component Value Date   CHOL 188 08/05/2022   HDL 76 08/05/2022   LDLCALC 93 08/05/2022   TRIG 96 08/05/2022   CHOLHDL 2.5 08/05/2022    Significant Diagnostic Results in last 30 days:  No results found.  Assessment/Plan  Major depressive disorder and anxiety disorder Currently on a reduced dose of venlafaxine  (Effexor ), taking 75 mg plus half of a 75 mg tablet due to pharmacy issues with the 37.5 mg dose. Reports doing well on this regimen without worsening of depression. Decision made to maintain the current dosage through the winter due to potential seasonal affective symptoms, with a plan to consider further reduction in the spring. Also taking alprazolam  as needed for anxiety. - Prescribe venlafaxine  75 mg and 37.5 mg for a 90-day supply with one refill to avoid breaking tablets. - Encourage staying active and finding ways to manage depression and anxiety.  Chronic constipation Managing chronic constipation with Linzess  72 mcg. Previously tried Amitiza , which was ineffective, and is not taking Phenergan . Uses Zofran  for nausea as needed. - Continue Linzess  72 mcg for chronic constipation. - Use Zofran  as needed for nausea.  General Health Maintenance Discussed the pneumonia vaccine, specifically the Prevnar 20, which provides comprehensive coverage. Will receive the flu shot at work and the COVID vaccine is available at the pharmacy. The shingles vaccine was also mentioned. Informed about potential side effects of the  pneumonia vaccine, similar to the flu shot, with some experiencing no side effects. - Administer Prevnar 20 pneumonia vaccine. - Receive flu shot at work. - Consider COVID and shingles vaccines at the pharmacy.  Follow-up Due for annual lab work, including kidney function, TSH, and cholesterol levels. Has upcoming appointments for follow-up on the estrogen patch and progesterone . - Order CBC with differential, CMP, TSH, and lipid panel. - Schedule fasting lab work appointment. - Schedule six-month follow-up appointment.   Family/ staff Communication: Reviewed plan of care with patient verbalized understanding   Labs/tests ordered:  - CBC with Differential/Platelet - CMP with eGFR([facility]) - TSH - Lipid panel  Next Appointment: Return in about 6 months (around 03/31/2024) for medical mangement of chronic issues., fasting labs soon.   Total time: 20 minutes. Greater than 50% of total time spent doing patient education regarding Major depression, chronic constipation,health maintenance including symptom/medication management.   Roxan JAYSON Plough, NP

## 2023-10-03 ENCOUNTER — Other Ambulatory Visit

## 2023-10-03 DIAGNOSIS — F418 Other specified anxiety disorders: Secondary | ICD-10-CM

## 2023-10-03 DIAGNOSIS — Z13220 Encounter for screening for lipoid disorders: Secondary | ICD-10-CM

## 2023-10-03 DIAGNOSIS — E0590 Thyrotoxicosis, unspecified without thyrotoxic crisis or storm: Secondary | ICD-10-CM

## 2023-10-03 NOTE — Progress Notes (Signed)
 This encounter was created in error - please disregard. Error.  This encounter was created in error - please disregard.

## 2023-10-04 LAB — CBC WITH DIFFERENTIAL/PLATELET
Absolute Lymphocytes: 1613 {cells}/uL (ref 850–3900)
Absolute Monocytes: 372 {cells}/uL (ref 200–950)
Basophils Absolute: 29 {cells}/uL (ref 0–200)
Basophils Relative: 0.4 %
Eosinophils Absolute: 88 {cells}/uL (ref 15–500)
Eosinophils Relative: 1.2 %
HCT: 41.2 % (ref 35.0–45.0)
Hemoglobin: 13.5 g/dL (ref 11.7–15.5)
MCH: 32.5 pg (ref 27.0–33.0)
MCHC: 32.8 g/dL (ref 32.0–36.0)
MCV: 99 fL (ref 80.0–100.0)
MPV: 9 fL (ref 7.5–12.5)
Monocytes Relative: 5.1 %
Neutro Abs: 5198 {cells}/uL (ref 1500–7800)
Neutrophils Relative %: 71.2 %
Platelets: 270 Thousand/uL (ref 140–400)
RBC: 4.16 Million/uL (ref 3.80–5.10)
RDW: 11.6 % (ref 11.0–15.0)
Total Lymphocyte: 22.1 %
WBC: 7.3 Thousand/uL (ref 3.8–10.8)

## 2023-10-04 LAB — COMPREHENSIVE METABOLIC PANEL WITH GFR
AG Ratio: 2.2 (calc) (ref 1.0–2.5)
ALT: 8 U/L (ref 6–29)
AST: 17 U/L (ref 10–35)
Albumin: 4.1 g/dL (ref 3.6–5.1)
Alkaline phosphatase (APISO): 89 U/L (ref 37–153)
BUN: 11 mg/dL (ref 7–25)
CO2: 29 mmol/L (ref 20–32)
Calcium: 8.8 mg/dL (ref 8.6–10.4)
Chloride: 106 mmol/L (ref 98–110)
Creat: 0.53 mg/dL (ref 0.50–1.03)
Globulin: 1.9 g/dL (ref 1.9–3.7)
Glucose, Bld: 91 mg/dL (ref 65–99)
Potassium: 4 mmol/L (ref 3.5–5.3)
Sodium: 141 mmol/L (ref 135–146)
Total Bilirubin: 1.4 mg/dL — ABNORMAL HIGH (ref 0.2–1.2)
Total Protein: 6 g/dL — ABNORMAL LOW (ref 6.1–8.1)
eGFR: 109 mL/min/1.73m2 (ref >=60)

## 2023-10-04 LAB — LIPID PANEL
Cholesterol: 173 mg/dL (ref <200)
HDL: 80 mg/dL (ref >=50)
LDL Cholesterol (Calc): 77 mg/dL
Non-HDL Cholesterol (Calc): 93 mg/dL (ref <130)
Total CHOL/HDL Ratio: 2.2 (calc) (ref <5.0)
Triglycerides: 84 mg/dL (ref <150)

## 2023-10-04 LAB — TSH: TSH: 0.05 m[IU]/L — ABNORMAL LOW

## 2023-10-06 ENCOUNTER — Ambulatory Visit: Payer: Self-pay | Admitting: Family

## 2023-10-06 DIAGNOSIS — R17 Unspecified jaundice: Secondary | ICD-10-CM

## 2023-10-06 DIAGNOSIS — E0590 Thyrotoxicosis, unspecified without thyrotoxic crisis or storm: Secondary | ICD-10-CM

## 2023-10-09 NOTE — Telephone Encounter (Signed)
 Message routed to PCP Ngetich, Roxan BROCKS, NP with patient reply.

## 2023-10-10 NOTE — Telephone Encounter (Signed)
Message routed to PCP Ngetich, Dinah C, NP  

## 2023-10-13 ENCOUNTER — Other Ambulatory Visit

## 2023-10-13 DIAGNOSIS — E0590 Thyrotoxicosis, unspecified without thyrotoxic crisis or storm: Secondary | ICD-10-CM

## 2023-10-13 LAB — T3: T3, Total: 105 ng/dL (ref 76–181)

## 2023-10-13 LAB — T4: T4, Total: 4.7 ug/dL — ABNORMAL LOW (ref 5.1–11.9)

## 2023-10-18 ENCOUNTER — Encounter: Payer: Self-pay | Admitting: Nurse Practitioner

## 2023-10-18 ENCOUNTER — Ambulatory Visit (INDEPENDENT_AMBULATORY_CARE_PROVIDER_SITE_OTHER): Admitting: Nurse Practitioner

## 2023-10-18 VITALS — BP 112/70 | Resp 16 | Wt 128.0 lb

## 2023-10-18 DIAGNOSIS — R7989 Other specified abnormal findings of blood chemistry: Secondary | ICD-10-CM

## 2023-10-18 DIAGNOSIS — Z79890 Hormone replacement therapy (postmenopausal): Secondary | ICD-10-CM

## 2023-10-18 MED ORDER — PROGESTERONE MICRONIZED 100 MG PO CAPS
100.0000 mg | ORAL_CAPSULE | Freq: Every day | ORAL | 3 refills | Status: AC
Start: 2023-10-18 — End: ?

## 2023-10-18 MED ORDER — ESTRADIOL 0.025 MG/24HR TD PTWK: 0.0250 mg | MEDICATED_PATCH | TRANSDERMAL | 3 refills | Status: AC

## 2023-10-18 NOTE — Progress Notes (Signed)
   Acute Office Visit  Subjective:    Patient ID: Carrie Jensen, female    DOB: 1970/07/14, 53 y.o.   MRN: [id number]   HPI 53 y.o. presents today for med follow up. Started HRT for hot flashes, night sweats and mood changes. Doing estradiol  patch 0.025 mg weekly and Prometrium  100 mg nightly. Feels her symptoms are improving. Low TSH, low T4 10/03/23. H/O hyperthyroidism, no intervention. Managed by PCP. Looks like TSH has been mildly low since 2017.   No LMP recorded (lmp unknown). Patient is postmenopausal.    Review of Systems  Constitutional: Negative.   Endocrine: Positive for heat intolerance.  Psychiatric/Behavioral:  Positive for agitation and sleep disturbance.        Objective:    Physical Exam Constitutional:      Appearance: Normal appearance.     BP 112/70   Resp 16   Wt 128 lb (58.1 kg)   LMP  (LMP Unknown) Comment: takes birth control cont  BMI 24.59 kg/m  Wt Readings from Last 3 Encounters:  10/18/23 128 lb (58.1 kg)  10/02/23 128 lb (58.1 kg)  09/20/23 126 lb (57.2 kg)         Assessment & Plan:   Problem List Items Addressed This Visit   None Visit Diagnoses       Postmenopausal hormone therapy    -  Primary   Relevant Medications   estradiol  (CLIMARA ) 0.025 mg/24hr patch   progesterone  (PROMETRIUM ) 100 MG capsule     Low TSH level          Plan: Recommend following up with PCP regarding abnormal thyroid  levels. Some of her symptoms could also be from overactive thyroid . Happy on current HRT doses. Recommend getting thyroid  managed prior to increasing doses in the future.   Return if symptoms worsen or fail to improve.    Carrie Jensen Carrie Shutter DNP, 2:20 PM 10/18/2023

## 2023-10-20 ENCOUNTER — Ambulatory Visit: Payer: Self-pay | Admitting: Family

## 2023-10-20 DIAGNOSIS — E0590 Thyrotoxicosis, unspecified without thyrotoxic crisis or storm: Secondary | ICD-10-CM

## 2023-11-16 ENCOUNTER — Other Ambulatory Visit: Payer: Self-pay | Admitting: Nurse Practitioner

## 2023-11-16 DIAGNOSIS — Z79890 Hormone replacement therapy (postmenopausal): Secondary | ICD-10-CM

## 2023-11-17 NOTE — Telephone Encounter (Signed)
 SABRA

## 2023-12-09 ENCOUNTER — Other Ambulatory Visit: Payer: Self-pay | Admitting: Gastroenterology

## 2024-01-17 ENCOUNTER — Ambulatory Visit: Payer: Self-pay

## 2024-01-17 NOTE — Telephone Encounter (Signed)
 Recommend urgent care for evaluation of mouth sore since it's an acute issues.there's no available Valacyclovir for refill on med list.

## 2024-01-17 NOTE — Telephone Encounter (Signed)
 FYI Only or Action Required?: Action required by provider: Pt requesting valacyclovir to be sent to the pharmacy to treat cold sore.  Patient was last seen in primary care on 10/02/2023 by Ngetich, Roxan BROCKS, NP.  Called Nurse Triage reporting Mouth Lesions.  Symptoms began today.  Interventions attempted: Nothing.  Symptoms are: gradually worsening.  Triage Disposition: Home Care  Patient/caregiver understands and will follow disposition?:   Reason for Disposition  Cold sore without complications  Answer Assessment - Initial Assessment Questions Pt calling in to report a cold sore to the Left lower lip, area is swollen, painful. Denies fever.   Pt wondering if she can get valcclovir called into the pharmacy.  1. APPEARANCE: What does the cold sore look like? (e.g., small bumps or blisters on the outer lip)     Yes  2. SIZE: How large an area is involved with the cold sores? (e.g., inches, cm or compare to coins)     N/a  3. LOCATION: Which part of the lip is involved?     Lower left  4. ONSET: When did the cold sore begin?     01/16/25 5. RECURRENT BLISTERS: Have you had cold sores before? If Yes, ask: When was the last time? How many times a year?     Denies  6. TREATMENT: Are you using any medicines for cold sores? (e.g., over-the-counter cream, antiviral pills)     None  7. OTHER SYMPTOMS: Do you have any other symptoms? (e.g., fever, sores inside mouth)     Denies  Protocols used: Cold Sores (Fever Blisters)-A-AH  Copied from CRM #[id number]. Topic: Clinical - Red Word Triage >> Jan 17, 2024  8:52 AM Susanna ORN wrote: Red Word that prompted transfer to Nurse Triage: Patient states she has a fever blister on her lip & has never had one before. States her lip is swollen & painful. Can feel it trying to ooze a little but it hasn't opened fully yet. Wants to see if Roxan can prescribe her some valacyclovir.

## 2024-01-19 NOTE — Telephone Encounter (Signed)
 Noted

## 2024-01-19 NOTE — Telephone Encounter (Signed)
 Spoke with patient this morning stating that she has refuse to go to the urgent care to pay another co-pay and will be doing over the counter medication for cold sores.   Message sent to Ngetich, Dinah C, NP

## 2024-01-19 NOTE — Telephone Encounter (Signed)
 Forwarded to [facility] in [place] to complete.

## 2024-01-23 ENCOUNTER — Other Ambulatory Visit: Payer: Self-pay | Admitting: Nurse Practitioner

## 2024-01-23 DIAGNOSIS — E042 Nontoxic multinodular goiter: Secondary | ICD-10-CM

## 2024-02-06 ENCOUNTER — Telehealth: Payer: Self-pay

## 2024-02-06 NOTE — Telephone Encounter (Unsigned)
 Copied from CRM #[id number]. Topic: Appointments - Transfer of Care >> Feb 06, 2024  3:45 PM DeAngela L wrote: Pt is requesting to transfer FROM: Ngetich, Dinah C, NP  Pt is requesting to transfer TO: Medina-Vargas, Monina C, NP Reason for requested transfer: patient would like to have the same provider as husband  It is the responsibility of the team the patient would like to transfer to (Dr. Phyllis, Jereld C, NP) to reach out to the patient if for any reason this transfer is not acceptable.

## 2024-02-06 NOTE — Telephone Encounter (Signed)
Please remove my name as PCP

## 2024-03-04 ENCOUNTER — Other Ambulatory Visit

## 2024-04-02 ENCOUNTER — Ambulatory Visit: Payer: Self-pay | Admitting: Family

## 2024-09-25 ENCOUNTER — Ambulatory Visit: Admitting: Nurse Practitioner
